# Patient Record
Sex: Female | Born: 1990 | Race: White | Hispanic: No | Marital: Married | Smoking: Never smoker
Health system: Southern US, Community
[De-identification: ages and names within clinical notes are randomized; demographics above are authoritative.]

## PROBLEM LIST (undated history)

## (undated) DIAGNOSIS — F329 Major depressive disorder, single episode, unspecified: Secondary | ICD-10-CM

## (undated) DIAGNOSIS — F32A Depression, unspecified: Secondary | ICD-10-CM

## (undated) DIAGNOSIS — K5090 Crohn's disease, unspecified, without complications: Secondary | ICD-10-CM

## (undated) DIAGNOSIS — K5010 Crohn's disease of large intestine without complications: Secondary | ICD-10-CM

## (undated) DIAGNOSIS — F419 Anxiety disorder, unspecified: Secondary | ICD-10-CM

## (undated) DIAGNOSIS — D649 Anemia, unspecified: Secondary | ICD-10-CM

## (undated) DIAGNOSIS — N809 Endometriosis, unspecified: Secondary | ICD-10-CM

## (undated) HISTORY — PX: PARTIAL COLECTOMY: SHX5273

## (undated) HISTORY — DX: Endometriosis, unspecified: N80.9

## (undated) HISTORY — DX: Anemia, unspecified: D64.9

## (undated) HISTORY — DX: Depression, unspecified: F32.A

## (undated) HISTORY — PX: WISDOM TOOTH EXTRACTION: SHX21

## (undated) HISTORY — DX: Crohn's disease of large intestine without complications: K50.10

## (undated) HISTORY — DX: Major depressive disorder, single episode, unspecified: F32.9

## (undated) HISTORY — DX: Anxiety disorder, unspecified: F41.9

## (undated) HISTORY — PX: OTHER SURGICAL HISTORY: SHX169

## (undated) HISTORY — PX: COLONOSCOPY: SHX174

---
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
## 2008-08-19 ENCOUNTER — Ambulatory Visit: Payer: Self-pay | Admitting: Family Medicine

## 2008-08-19 ENCOUNTER — Encounter: Payer: Self-pay | Admitting: Family Medicine

## 2008-08-19 DIAGNOSIS — L600 Ingrowing nail: Secondary | ICD-10-CM | POA: Insufficient documentation

## 2008-09-06 ENCOUNTER — Emergency Department (HOSPITAL_COMMUNITY): Admission: EM | Admit: 2008-09-06 | Discharge: 2008-09-06 | Payer: Self-pay | Admitting: Family Medicine

## 2008-12-03 ENCOUNTER — Ambulatory Visit: Payer: Self-pay | Admitting: Family Medicine

## 2008-12-03 ENCOUNTER — Telehealth: Payer: Self-pay | Admitting: Family Medicine

## 2008-12-03 ENCOUNTER — Encounter (INDEPENDENT_AMBULATORY_CARE_PROVIDER_SITE_OTHER): Payer: Self-pay | Admitting: Family Medicine

## 2008-12-03 LAB — CONVERTED CEMR LAB
Basophils Absolute: 0 10*3/uL (ref 0.0–0.1)
Basophils Relative: 0 % (ref 0–1)
Eosinophils Absolute: 0.2 10*3/uL (ref 0.0–0.7)
Eosinophils Relative: 2 % (ref 0–5)
HCT: 36.4 % (ref 36.0–46.0)
Hemoglobin: 11.7 g/dL — ABNORMAL LOW (ref 12.0–15.0)
Lymphocytes Relative: 30 % (ref 12–46)
Lymphs Abs: 2.2 10*3/uL (ref 0.7–4.0)
MCHC: 32.1 g/dL (ref 30.0–36.0)
MCV: 82.4 fL (ref 78.0–100.0)
Monocytes Absolute: 0.7 10*3/uL (ref 0.1–1.0)
Monocytes Relative: 10 % (ref 3–12)
Neutro Abs: 4.4 10*3/uL (ref 1.7–7.7)
Neutrophils Relative %: 58 % (ref 43–77)
Platelets: 371 10*3/uL (ref 150–400)
RBC: 4.42 M/uL (ref 3.87–5.11)
RDW: 13.7 % (ref 11.5–15.5)
WBC: 7.6 10*3/uL (ref 4.0–10.5)

## 2008-12-05 ENCOUNTER — Encounter: Payer: Self-pay | Admitting: Family Medicine

## 2008-12-05 ENCOUNTER — Telehealth: Payer: Self-pay | Admitting: Family Medicine

## 2008-12-05 ENCOUNTER — Ambulatory Visit (HOSPITAL_COMMUNITY): Admission: RE | Admit: 2008-12-05 | Discharge: 2008-12-05 | Payer: Self-pay | Admitting: Family Medicine

## 2008-12-05 ENCOUNTER — Ambulatory Visit: Payer: Self-pay | Admitting: Family Medicine

## 2008-12-05 LAB — CONVERTED CEMR LAB
Blood in Urine, dipstick: NEGATIVE
Glucose, Urine, Semiquant: NEGATIVE
Nitrite: NEGATIVE
Specific Gravity, Urine: 1.025
WBC Urine, dipstick: NEGATIVE
Whiff Test: NEGATIVE

## 2008-12-06 ENCOUNTER — Encounter: Payer: Self-pay | Admitting: Family Medicine

## 2008-12-07 ENCOUNTER — Encounter: Payer: Self-pay | Admitting: Family Medicine

## 2008-12-09 ENCOUNTER — Encounter: Payer: Self-pay | Admitting: Family Medicine

## 2008-12-17 ENCOUNTER — Ambulatory Visit: Payer: Self-pay | Admitting: Family Medicine

## 2009-02-06 ENCOUNTER — Encounter: Payer: Self-pay | Admitting: Family Medicine

## 2009-02-06 ENCOUNTER — Ambulatory Visit: Payer: Self-pay | Admitting: Family Medicine

## 2009-04-04 ENCOUNTER — Emergency Department (HOSPITAL_COMMUNITY): Admission: EM | Admit: 2009-04-04 | Discharge: 2009-04-04 | Payer: Self-pay | Admitting: Family Medicine

## 2009-05-13 ENCOUNTER — Telehealth: Payer: Self-pay | Admitting: Family Medicine

## 2009-05-14 ENCOUNTER — Encounter: Payer: Self-pay | Admitting: Family Medicine

## 2009-05-14 ENCOUNTER — Ambulatory Visit: Payer: Self-pay | Admitting: Family Medicine

## 2009-05-14 DIAGNOSIS — R300 Dysuria: Secondary | ICD-10-CM | POA: Insufficient documentation

## 2009-09-11 ENCOUNTER — Telehealth (INDEPENDENT_AMBULATORY_CARE_PROVIDER_SITE_OTHER): Payer: Self-pay | Admitting: *Deleted

## 2009-09-14 ENCOUNTER — Ambulatory Visit: Payer: Self-pay | Admitting: Family Medicine

## 2009-09-14 DIAGNOSIS — L2089 Other atopic dermatitis: Secondary | ICD-10-CM | POA: Insufficient documentation

## 2009-09-14 DIAGNOSIS — N926 Irregular menstruation, unspecified: Secondary | ICD-10-CM | POA: Insufficient documentation

## 2009-09-14 DIAGNOSIS — N939 Abnormal uterine and vaginal bleeding, unspecified: Secondary | ICD-10-CM

## 2009-09-24 ENCOUNTER — Encounter: Payer: Self-pay | Admitting: Family Medicine

## 2009-09-24 ENCOUNTER — Telehealth: Payer: Self-pay | Admitting: Family Medicine

## 2009-09-24 ENCOUNTER — Ambulatory Visit: Payer: Self-pay | Admitting: Family Medicine

## 2010-03-02 NOTE — Assessment & Plan Note (Signed)
Summary: very heavy menses/[name]/Leslie Bolton   Vital Signs:  Patient profile:   20 year old female Height:      68.2 inches Weight:      144 pounds BMI:     21.85 Temp:     98.2 degrees F oral Pulse rate:   59 / minute BP sitting:   114 / 73  (left arm) Cuff size:   regular  Vitals Entered By: Schuyler Amor CMA (September 24, 2009 9:21 AM) CC: Heavy menses Pain Assessment Patient in pain? yes     Location: abdomen Intensity: 6   Primary Care Provider:  [name] MD  CC:  Heavy menses.  History of Present Illness: 20 y/o F here for menorrhagia x 1 day  Menses usually lasts 5 days.  Pt endorses history heavy menses.  Usually she changes tampons every 3 hrs for days 3 and 4 of cycle.  But today she is having to double up on tampons AND pads and is changing 4x  in 3 hrs.  This is not normal for her.   She has been on birth control pills for 2 yrs.  She was complaining of painful periods so in April Dr [name] advised her to take OCP continuously for 3 months, then herself have one cycle.  This is her 2nd cycle since starting this regimen.  For the most part last menstrual cycle was  normal. 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   Allergies: No Known Drug Allergies  Review of Systems       per hpi  Physical Exam  General:  Well-developed,well-nourished,in no acute distress; alert,appropriate and cooperative throughout examination Lungs:  Normal respiratory effort, chest expands symmetrically. Lungs are clear to auscultation, no crackles or wheezes. Heart:  Normal rate and regular rhythm. S1 and S2 normal without gallop, murmur, click, rub or other extra sounds. Genitalia:  pelvic exam wnl   Impression & Recommendations:  Problem # 1:  ABNORMAL VAGINAL BLEEDING (ICD-626.9) Assessment  New Menorrhagia that started today, 2nd day of her menstrual cycle.  Cycle usually lasts 5 days.  Hb normal at 12.3.   Since this is the first instance of menorrhagia since changing the ocp routine to 3 consecutive months before a menses, I would not like to make changes at this time.  I do expect her cycle to finish this weekend, or at the least decrease in quantity.  Pt to rtc on Monday if still bleeding.  We discussed the plan for future work up should this occur again, including ultrasound.  We discussed premarin to stop bleeding if needed.   Discussed ibuprofen 660m qid, which has been shown to decrease menstrual quantity. I sent rx in but pt can take otc ibuprofen 2075m(3tabs) if she likes.    Her updated medication list for this problem includes:    Apri 0.15-30 Mg-mcg Tabs (Desogestrel-ethinyl estradiol) ...[name]. Take as directed  Orders: Hemoglobin-[facility] ([id number] Level  3 ([phone] Complete Medication List: 1)  Apri 0.15-30 Mg-mcg Tabs (Desogestrel-ethinyl estradiol) .... Take as directed 2)  Ibuprofen 600 Mg Tabs (Ibuprofen) ...[name] 1 tab by mouth four times daily for pain Prescriptions: IBUPROFEN 600 MG TABS (IBUPROFEN) 1 tab by mouth four times daily for pain  #20 x 3   Entered and Authorized by:   CaMerla RichesD  Signed by:   Merla Riches MD on 09/24/2009   Method used:   Electronically to        [facility] [address] # [phone]* (retail)       [address]       [place], [place]  [address]       Ph: [phone]       Fax: [phone]   RxID:   [id number]   Laboratory Results   Blood Tests   Date/Time Received: September 24, 2009 10:15 AM  Date/Time Reported: September 24, 2009 10:32 AM     CBC   HGB:  12.3 g/dL   (Normal Range: 13.0-17.0 in Males, 12.0-15.0 in Females) Comments: ...........test performed by...........Marland KitchenHedy Camara, CMA

## 2010-03-02 NOTE — Progress Notes (Signed)
Summary: triage  Phone Note Call from Patient Call back at [phone]   Caller: Patient Summary of Call: Has a rash on upper chest, upper back and on neck.  Initial call taken by: Raymond Gurney,  September 11, 2009 4:06 PM  Follow-up for Phone Call        spoke with patient and she reports rash on upper chest and back and neck, started yesterday. does not itch. does not look like hives she states. appointment scheduled for Monday to have checked.   then patient states she has been on birth control for 2 years and this week she has had some irregular bleeding which she describes as heavy. she is having some cramping .  she is in the middle of her cycle . has not missed any pills . having to use 3 tampons  or pads daily. suggested she go somewhere this weekend to have evaluated if cramping becomes worse. will advise MD of this problem also so this can be addressed Monday if she does not  have it checked into sooner. Follow-up by: Marcell Barlow RN,  September 11, 2009 4:18 PM

## 2010-03-02 NOTE — Assessment & Plan Note (Signed)
Summary: uti per pt/[place]/neal   Vital Signs:  Patient profile:   20 year old female Weight:      153 pounds Temp:     98 degrees F oral Pulse rate:   64 / minute Pulse rhythm:   regular BP sitting:   116 / 73  (right arm) Cuff size:   regular  Vitals Entered By: Audelia Hives CMA (May 14, 2009 8:34 AM) CC: ?UTI and change birth control   CC:  ?UTI and change birth control.  History of Present Illness: 1.  ?UTI--urgency, frequency, and dysuria past 2 days.  some back discomfort and nausea, but no hematuria, fever, abd pain.  has not had a uti since she was a child.  2.  birth control.  interested in talking about different birth control options.  has been on apri ocps for a while, but still has heavy periods and cramping.  interested in exploring other options that may help  Current Medications (verified): 1)  Apri 0.15-30 Mg-Mcg  Tabs (Desogestrel-Ethinyl Estradiol) .... As Directed  Allergies: No Known Drug Allergies  Review of Systems GU:  Complains of urinary frequency and urinary hesitancy; denies abnormal vaginal bleeding, discharge, and hematuria.  Physical Exam  General:  Well-developed,well-nourished,in no acute distress; alert,appropriate and cooperative throughout examination Abdomen:  soft.  very mild suprapubic tenderness on palpation.  soft, non-tender, no distention, no masses, no guarding, no rigidity, and no rebound tenderness.   Additional Exam:  vital signs reviewed    Impression & Recommendations:  Problem # 1:  DYSURIA (ICD-788.1) Assessment New  symptoms consistent with uti.  will treat with cipro.  send urine for culture.   Orders: Urinalysis-[facility] (00000) Urine Culture-[facility] ([id number]) [place]- Est Level  3 ([address]323)  Her updated medication list for this problem includes:    Ciprofloxacin Hcl 250 Mg Tabs (Ciprofloxacin hcl) .[name]... 1 tab by mouth two times a day for 5 days.  Problem # 2:  ORAL CONTRACEPTION (ICD-V25.41) Assessment:  Unchanged  discussed options.  she would like to try skipping the placebo pills for 2 months then taking them the third month.  this would cut down on thenumber of periods she has.  will send new prescription  Orders: [facility]- Est Level  3 (55732)  Complete Medication List: 1)  Apri 0.15-30 Mg-mcg Tabs (Desogestrel-ethinyl estradiol) .... Take as directed 2)  Ciprofloxacin Hcl 250 Mg Tabs (Ciprofloxacin hcl) .[name].. 1 tab by mouth two times a day for 5 days.  Patient Instructions: 1)  It was nice to see you today. 2)  You have a urinary tract infection.   3)  Take the antibiotics I prescribed you. 4)  For your birth control, on the 1st pack, take the first 21 pills (one pill daily).  Then skip the last 7 pills.  Do the same thing for the second pack.  Then on the 3rd pack, take all the pills.  Then start the process over. 5)  Call me if you have any problems or questions. Prescriptions: CIPROFLOXACIN HCL 250 MG TABS (CIPROFLOXACIN HCL) 1 tab by mouth two times a day for 5 days.  #10 x 0   Entered and Authorized by:   Carin Hock MD   Signed by:   Carin Hock MD on 05/14/2009   Method used:   Electronically to        [facility]. [address] [phone]* (retail)       [address]  [place], [place]  [address]       Ph: [phone] or [phone]       Fax: [phone]   RxID:   [id number] APRI 0.15-30 MG-MCG  TABS (DESOGESTREL-ETHINYL ESTRADIOL) take as directed  #3 packs x 4   Entered and Authorized by:   Carin Hock MD   Signed by:   Carin Hock MD on 05/14/2009   Method used:   Electronically to        [facility]. [address] [phone]* (retail)       [address]       [place], [place]  [address]       Ph: [phone] or [phone]       Fax: [phone]   RxID:   [id number]   Appended Document: urine report    Lab Visit  Laboratory Results   Urine Tests  Date/Time Received: May 14, 2009 8:39  AM  Date/Time Reported: May 14, 2009 9:10 AM   Routine Urinalysis   Color: yellow Appearance: Cloudy Glucose: negative   (Normal Range: Negative) Bilirubin: negative   (Normal Range: Negative) Ketone: negative   (Normal Range: Negative) Spec. Gravity: 1.020   (Normal Range: 1.003-1.035) Blood: moderate   (Normal Range: Negative) pH: 5.5   (Normal Range: 5.0-8.0) Protein: 100   (Normal Range: Negative) Urobilinogen: 0.2   (Normal Range: 0-1) Nitrite: negative   (Normal Range: Negative) Leukocyte Esterace: moderate   (Normal Range: Negative)  Urine Microscopic WBC/HPF: LOADED RBC/HPF: >20 Bacteria/HPF: 2+ Epithelial/HPF: rare    Comments: ...............test performed by......Marland KitchenBonnie A. Martinique, MLS (ASCP)cm    Orders Today:

## 2010-03-02 NOTE — Miscellaneous (Signed)
Summary: Procedure Consent  Procedure Consent   Imported By: Audie Clear 02/10/2009 15:47:39  _____________________________________________________________________  External Attachment:    Type:   Image     Comment:   External Document

## 2010-03-02 NOTE — Assessment & Plan Note (Signed)
Summary: ingrown toenail,df   Vital Signs:  Patient profile:   20 year old female Weight:      159.9 pounds Temp:     98.1 degrees F oral Pulse rate:   66 / minute Pulse rhythm:   regular BP sitting:   120 / 77  (right arm) Cuff size:   regular  Vitals Entered By: Audelia Hives CMA (February 06, 2009 1:36 PM)  CC:  toenail ingrown again.  History of Present Illness: 1.  toenail--partial right great toe medial surface removedl on 7/20.  did well until late december.  looks ingrown again.  hurting.  a little red.  maybe some pus coming out.  soaking it in hydrogen peroxide and putting neosporin on it.    no fevers.  no rashes.    Allergies: No Known Drug Allergies  Past History:  Past Medical History: neg ingrown toenail right great toe  Physical Exam  General:  Well-developed,well-nourished,in no acute distress; alert,appropriate and cooperative throughout examination Extremities:  right great toe:  ingrown toenail medial surface.  mild surrounding erythema just on distal tow.  ttp.  no pus Additional Exam:  vital signs reviewed    Impression & Recommendations:  Problem # 1:  INGROWN TOENAIL (ICD-703.0) Assessment Deteriorated  doubt infection.  partial removal again.    Procedure note:  verbal and written consent obtained.  Areas prepped with betadine.  tourniquet placed on right 1st toe.  digit block with 2% lidocaine without epi.  lateral aspect of nail removed.  phenol applied.  area cleansed with alcohol.  minimal bleeding.   Pt tolerated procedure well.  No complications.    gave red flags for return.  gave instructions for wound care    Orders: [facility]- Est Level  3 (33582) Provider Misc Charge- [facility] (Misc)  Complete Medication List: 1)  Apri 0.15-30 Mg-mcg Tabs (Desogestrel-ethinyl estradiol) .... As directed  Patient Instructions: 1)  verbal instructions given

## 2010-03-02 NOTE — Assessment & Plan Note (Signed)
Summary: RASH/LS   Vital Signs:  Patient profile:   20 year old female Height:      68.2 inches Weight:      148 pounds BMI:     22.45 BSA:     1.80 Temp:     98.0 degrees F Pulse rate:   57 / minute BP sitting:   120 / 74  Vitals Entered By: Christen Bame CMA (September 14, 2009 9:28 AM) CC: rash x 4 days, OCP complaint Is Patient Diabetic? No Pain Assessment Patient in pain? no        Primary Care Provider:  [name] MD  CC:  rash x 4 days and OCP complaint.  History of Present Illness: Pt presents with a pruritic rash since thursday.  no fevers, no new meds, no new soaps, not worse with heat.  mostly behind ears, some on chest and back. better with benedryl.    OCPs- recently changed to taking OCP for 3 months straight then a pill free period.  knows to toss the placebos, taking daily.  did well the first cycle, this time having breakthrough bleeding.  No unusual cramps, abd pain.  no new sexual partners.  last intercourse 6 weeks ago.  Habits & Providers  Alcohol-Tobacco-Diet     Tobacco Status: never  Current Medications (verified): 1)  Apri 0.15-30 Mg-Mcg  Tabs (Desogestrel-Ethinyl Estradiol) .... Take As Directed  Allergies (verified): No Known Drug Allergies  Review of Systems  The patient denies anorexia, fever, and headaches.    Physical Exam  General:  Well-developed,well-nourished,in no acute distress; alert,appropriate and cooperative throughout examination Lungs:  Normal respiratory effort, chest expands symmetrically. Lungs are clear to auscultation, no crackles or wheezes. Heart:  Normal rate and regular rhythm. S1 and S2 normal without gallop, murmur, click, rub or other extra sounds. Abdomen:  Bowel sounds positive,abdomen soft and non-tender without masses, organomegaly or hernias noted. Skin:  pink rash behind ears with papules.  no drainage, looked slightly excoriated.  no vesicles.  papules on chest and back were heterogeneous, some looked  more like vesicles, some papules.  Also some acne present. Cervical Nodes:  No lymphadenopathy noted   Impression & Recommendations:  Problem # 1:  DERMATITIS, ATOPIC (ICD-691.8) Assessment New likely atopic dermatitis due to some contact allergen.  already improving.  advised benedryl at night, claritin during day if desired, and some amounts of hydrocortisone topically for itch. ?poison ivy but does not have vesicles behind ears. Orders: [place]- Est Level  3 (01749)  Problem # 2:  ABNORMAL VAGINAL BLEEDING (ICD-626.9) Assessment: New likely breakthrough due to change in administration of pills.  u preg negative.  advised to RTC if this continued through multiple cycles to reevaluate how she took the pills Her updated medication list for this problem includes:    Apri 0.15-30 Mg-mcg Tabs (Desogestrel-ethinyl estradiol) .[name]... Take as directed  Orders: U Preg-[facility] (81025) [place]- Est Level  3 (44967)  Complete Medication List: 1)  Apri 0.15-30 Mg-mcg Tabs (Desogestrel-ethinyl estradiol) .... Take as directed  Laboratory Results   Urine Tests  Date/Time Received: September 14, 2009 9:54 AM  Date/Time Reported: September 14, 2009 10:01 AM     Urine HCG: negative Comments: ...............test performed by......Marland KitchenBonnie A. Martinique, MLS (ASCP)cm

## 2010-03-02 NOTE — Progress Notes (Signed)
Summary: triage  Phone Note Call from Patient Call back at [phone]   Caller: Patient Summary of Call: has uti and would like something called in- has appt on Friday but can't wait that long and has to work [employer] Initial call taken by: Audie Clear,  May 13, 2009 1:49 PM  Follow-up for Phone Call        told her we cannot rx without seeing her. appt at 8:15 tomorrow am to accomodate her schedule. meantime to drink plenty of water & take ibu or tylenol Follow-up by: Elige Radon RN,  May 13, 2009 1:52 PM

## 2010-03-02 NOTE — Letter (Signed)
Summary: Out of [place]  [address]   [place], [place] [address]   Phone: [phone]  Fax: [phone]    September 24, 2009   Student:  MYESHA STILLION    To Whom It May Concern:   For Medical reasons, please excuse the above named student from school for the following dates:  Start:   September 24, 2009  Can return on September 25, 2009.   If you need additional information, please feel free to contact our office.   Sincerely,    Cat Ta MD    ****This is a legal document and cannot be tampered with.  Schools are authorized to verify all information and to do so accordingly.

## 2010-03-02 NOTE — Progress Notes (Signed)
Summary: triage  Phone Note Call from Patient Call back at [phone]   Caller: Patient Summary of Call: Pt is having her period and had to use 4 tampons in 3 hrs.   Initial call taken by: Raymond Gurney,  September 24, 2009 8:43 AM  Follow-up for Phone Call        states this very unusual for her. started today after 3 hrs at work. concerned & wants to be seen before she goes to class at 10:30. asked her to come in now Follow-up by: Elige Radon RN,  September 24, 2009 8:49 AM  Additional Follow-up for Phone Call Additional follow up Details #1::        TRIAGE I will see her in [facility] cliinic when she gets here  [name] MD  September 24, 2009 10:01 AM oops already seen by Dr Earley Favor

## 2010-03-11 ENCOUNTER — Encounter: Payer: Self-pay | Admitting: *Deleted

## 2010-03-18 ENCOUNTER — Telehealth: Payer: Self-pay | Admitting: Family Medicine

## 2010-03-19 ENCOUNTER — Telehealth: Payer: Self-pay | Admitting: Family Medicine

## 2010-03-19 MED ORDER — NORETHIN ACE-ETH ESTRAD-FE 1-20 MG-MCG(24) PO TABS: 1.0000 | ORAL_TABLET | Freq: Every day | ORAL | Status: DC

## 2010-03-19 NOTE — Telephone Encounter (Signed)
Change in OCP

## 2010-03-26 NOTE — Telephone Encounter (Signed)
Has been taken car of

## 2010-06-21 ENCOUNTER — Ambulatory Visit: Payer: Self-pay | Admitting: Family Medicine

## 2010-11-30 ENCOUNTER — Telehealth: Payer: Self-pay | Admitting: Family Medicine

## 2010-11-30 NOTE — Telephone Encounter (Signed)
Please call Leslie Bolton at earliest convenience tomorrow to disuss bc rx

## 2010-12-01 MED ORDER — DESOGESTREL-ETHINYL ESTRADIOL 0.15-30 MG-MCG PO TABS: 1.0000 | ORAL_TABLET | Freq: Every day | ORAL | Status: DC

## 2010-12-01 NOTE — Telephone Encounter (Signed)
Acne worse on the current med--wantsto change back

## 2010-12-02 ENCOUNTER — Ambulatory Visit: Payer: Self-pay | Admitting: Family Medicine

## 2011-01-13 ENCOUNTER — Other Ambulatory Visit: Payer: Self-pay | Admitting: Family Medicine

## 2011-01-13 MED ORDER — DESOGESTREL-ETHINYL ESTRADIOL 0.15-30 MG-MCG PO TABS: 1.0000 | ORAL_TABLET | Freq: Every day | ORAL | Status: DC

## 2011-01-14 ENCOUNTER — Ambulatory Visit
Admission: RE | Admit: 2011-01-14 | Discharge: 2011-01-14 | Disposition: A | Discharge status: Home / Self Care | Payer: Managed Care, Other (non HMO) | Source: Ambulatory Visit | Attending: Family Medicine | Admitting: Family Medicine

## 2011-01-14 ENCOUNTER — Ambulatory Visit (INDEPENDENT_AMBULATORY_CARE_PROVIDER_SITE_OTHER): Payer: Managed Care, Other (non HMO) | Admitting: Family Medicine

## 2011-01-14 DIAGNOSIS — M25532 Pain in left wrist: Secondary | ICD-10-CM

## 2011-01-14 DIAGNOSIS — M25539 Pain in unspecified wrist: Secondary | ICD-10-CM

## 2011-01-14 NOTE — Progress Notes (Signed)
  Subjective:    Patient ID: Leslie Bolton, female    DOB: Jan 06, 1991, 20 y.o.   MRN: [id number]  HPI  Date of injury: Bolton 13, 2012. Left wrist pain since last night. She was working on an elliptical (exercise equipment) when equipment accidentally turned over and fell on her left wrist. She had immediate pain. Pain has not gotten much better. She's not had a lot of swelling. She is essentially unable to use the wrist because of pain. PERTINENT  PMH / PSH: No prior history of left wrist injury or left wrist surgery. Not diabetic. Nonsmoker.  Review of Systems    denies unusual weight change, denies fever. Has seen mild swelling of the wrist. Denies numbness or tingling in her hand. Objective:   Physical Exam  GENERAL: Well-developed female no acute distress LEFT WRIST: There to palpation dorsally and over the snuffbox. There is no deformity noted. There is no bruising. There is a very impressive from where one of the screws on the machine if her wrist. She has pain with wrist extension greater than wrist flexion. She does have full range of motion passively in extension and flexion. Grip strength is somewhat limited by pain but essentially normal. All fingers and thumb at all range of motion and normal strength. ULTRASOUND:   Attempted ultrasound of the left wrist. There is an apparent irregularity at the distal radius. She was extremely tender so I did not complete the exam and I will not charge for this.     Assessment & Plan:  Wrist pain, suspect fracture. Some concern for scaphoid injury. Placed her in  scaphoid splint. Will send her for xrays left wrist and scaphoid views. I will call her this afternoon with results.[name]

## 2011-01-14 NOTE — Progress Notes (Signed)
Cell # Is [id number] Xray negative per radiology---there is a very small dorsal cortex "fragment" that is probably artifact. I discussed with her. Rec splint next  Few days weaning out of it toward the end of the week--if she is not 80-90% better by end of week, she will call and be seen for further eval.

## 2011-01-21 ENCOUNTER — Ambulatory Visit (INDEPENDENT_AMBULATORY_CARE_PROVIDER_SITE_OTHER): Payer: Managed Care, Other (non HMO) | Admitting: Family Medicine

## 2011-01-21 VITALS — BP 120/68

## 2011-01-21 DIAGNOSIS — M25532 Pain in left wrist: Secondary | ICD-10-CM

## 2011-01-21 DIAGNOSIS — M25539 Pain in unspecified wrist: Secondary | ICD-10-CM

## 2011-01-29 DIAGNOSIS — M25532 Pain in left wrist: Secondary | ICD-10-CM | POA: Insufficient documentation

## 2011-01-29 NOTE — Progress Notes (Signed)
  Subjective:    Patient ID: Leslie Bolton, female    DOB: Jun 29, 1990, 20 y.o.   MRN: [id number]  HPI  F/u wrist injury. Has been wearing brace, still very sore and painful. Review of Systems  No numbness in hand, no redness of wrist, no fever.    Objective:   Physical Exam  Vital signs reviewed. GENERAL: Well developed, well nourished, no acute distress Left wrist still swollen and ttp over dorsal aspect. FROm and full strength all fingers, normal sensation in hand.       Assessment & Plan:  Wrist pain, x ray negative. She has a bad contusion to the bone--will continue spica splint and f/u 1-2 weeks. Likely at that time if resolving, start brief rehab for wrist strength.

## 2011-02-07 ENCOUNTER — Ambulatory Visit: Payer: Managed Care, Other (non HMO) | Admitting: Family Medicine

## 2011-12-27 ENCOUNTER — Encounter: Payer: Self-pay | Admitting: Family Medicine

## 2011-12-27 ENCOUNTER — Ambulatory Visit (INDEPENDENT_AMBULATORY_CARE_PROVIDER_SITE_OTHER): Payer: Managed Care, Other (non HMO) | Admitting: Family Medicine

## 2011-12-27 VITALS — BP 103/73 | HR 75 | Temp 98.3°F | Wt 180.2 lb

## 2011-12-27 DIAGNOSIS — R739 Hyperglycemia, unspecified: Secondary | ICD-10-CM

## 2011-12-27 DIAGNOSIS — R079 Chest pain, unspecified: Secondary | ICD-10-CM

## 2011-12-27 DIAGNOSIS — R635 Abnormal weight gain: Secondary | ICD-10-CM

## 2011-12-27 DIAGNOSIS — R7309 Other abnormal glucose: Secondary | ICD-10-CM

## 2011-12-27 MED ORDER — METFORMIN HCL 500 MG PO TABS: 500.0000 mg | ORAL_TABLET | Freq: Two times a day (BID) | ORAL | Status: DC

## 2011-12-27 NOTE — Assessment & Plan Note (Signed)
Unclear etiology for weight gain.  She is very active and it is possible this activity has triggered her hunger to point where she overeats more than she can burn off when she exercises (runs 2-3 miles 2 times a week).   CBG here was within normal limits for fasting. Discussed risks and benefits of starting Metformin.  Discussed GI distress.  Patient expressed understanding and wishes to proceed to see if it helps with weight loss. Only thought would be to check TSH if patient continues to experience weight gain.   Unlikely secondary to OCPs as she has been on these for years without any previous episodes of weight gain.   FU 4 -6 weeks for re-evaluation, sooner if any side effects from medicine or continued weight gain.

## 2011-12-27 NOTE — Patient Instructions (Addendum)
Take the Metformin 1 pill with a meal once a day for the first 3 days.  After that, take it twice daily with meals.  Come back in 4 - 6 weeks so we can see how you're doing on the medicine.    If you start noticing any redness or swelling in your legs, trouble breathing, or chest pain with exertion please come back to see [place] or go to the ED.    It was good to meet you today!

## 2011-12-27 NOTE — Progress Notes (Signed)
Patient ID: Leslie Bolton, female   DOB: [birth year], 21 y.o.   MRN: [id number] Leslie Bolton is a 21 y.o. female who presents to [facility] today for 2 concerns:  1.  Weight gain:  Present for past 2-3 months.  Started counseling this past summer and was put on Fluoxetine in September.  Stopped this in October.  Noted weight gain in October.  Baseline weight around 150 lbs.  Not weighing herself at home but does note that her clothes are tighter fitting and that she feels like she has gained weight.  Was surprised at the scale when she saw her weight today.  Mother is a Engineer, maintenance (IT) and she is very health-conscious, eating mostly only meats, fruits, and vegetables.  Also very active with cycling and running during the week and horseback riding on the weekends.  Had cousin who had very similar symptoms and was put on Metformin and experienced weight loss, asking if she can try this.    Has been on same form of OCP for several years.  No nausea or vomiting, no heat or cold intolerance.  No hirsutism.    2.  Chest pain:  Present for past 4 - 6 weeks, unsure exactly how long.  Describes fluttering and "throbbing" in upper left chest that occurs only when she is at work.  When she focuses on her breathing, this resolves.  Believes it is related to stress.  No chest pain or dyspnea on exertion.  Denies any chest pain at any other time of day.  Not related to food intake.  No lower extremity edema or redness.  Does note some varicose veins on Left calf.    The following portions of the patient's history were reviewed and updated as appropriate: allergies, current medications, past medical history, family and social history, and problem list.  Patient is a nonsmoker.  History reviewed. No pertinent past medical history.  ROS as above otherwise neg. No Chest pain, palpitations, SOB, Fever, Chills, Abd pain, N/V/D.  Medications reviewed. Current Outpatient Prescriptions  Medication Sig Dispense Refill  .  desogestrel-ethinyl estradiol (APRI,EMOQUETTE,SOLIA) 0.15-30 MG-MCG tablet Take 1 tablet by mouth daily.  3 Package  3  . ibuprofen (ADVIL,MOTRIN) 600 MG tablet Take 600 mg by mouth 4 (four) times daily. For pain               Exam:  BP 103/73  Pulse 75  Temp 98.3 F (36.8 C) (Oral)  Wt 180 lb 3.2 oz (81.738 kg) Gen: Well NAD, overweight.  Pleasant and conversant.   HEENT: EOMI,  MMM Lungs: CTABL Nl WOB Heart: RRR Abd: NABS, NT, ND Exts: Non edematous BL  LE, warm and well perfused, calves symmetric and nontender.  Faint varicose veins noted Left calf.    Results for orders placed in visit on 12/27/11 (from the past 72 hour(s))  GLUCOSE, CAPILLARY     Status: Normal   Collection Time   12/27/11 10:04 AM      Component Value Range Comment   Glucose-Capillary 80  70 - 99 mg/dL

## 2011-12-27 NOTE — Assessment & Plan Note (Signed)
No red flags.  Not cardiac chest pain, also does not seem to be pulmonary chest pain as no dyspnea or shortness of breath. Only concern is female of this age on OCPs would be PE, but she has not LE edema, no tachypnea, no palpitations/tachycardia, and no dyspnea.  Chest pain only occurs at work and never at other times, resolved with deep breathing.   Likely related to stress.   I provided the patient with explicit warnings and red flags both written and verbally that would prompt return to clinic or the ED.

## 2012-05-03 ENCOUNTER — Telehealth: Payer: Self-pay | Admitting: Family Medicine

## 2012-05-03 NOTE — Telephone Encounter (Signed)
2 issues:    1) we don't generally prescribe weight loss drugs at this clinic 2) if she does want something, she would need to schedule with her PCP Dr. Nori Riis, not with a work-in physician.

## 2012-05-03 NOTE — Telephone Encounter (Signed)
LVM for patein to call back to inform her of below.

## 2012-05-03 NOTE — Telephone Encounter (Signed)
Patient is calling to ask Dr. Mingo Amber about changing Metformin to Phentermine.

## 2012-05-03 NOTE — Telephone Encounter (Signed)
Related message,pt already scheduled appt with Dr Nori Riis. Janeya Deyo, Lewie Loron

## 2012-05-23 ENCOUNTER — Encounter: Payer: Self-pay | Admitting: Family Medicine

## 2012-05-23 ENCOUNTER — Ambulatory Visit (INDEPENDENT_AMBULATORY_CARE_PROVIDER_SITE_OTHER): Payer: BC Managed Care – PPO | Admitting: Family Medicine

## 2012-05-23 VITALS — BP 117/68 | HR 71 | Temp 99.1°F | Ht 68.0 in | Wt 184.0 lb

## 2012-05-23 DIAGNOSIS — R635 Abnormal weight gain: Secondary | ICD-10-CM

## 2012-05-23 DIAGNOSIS — R5383 Other fatigue: Secondary | ICD-10-CM

## 2012-05-23 DIAGNOSIS — Z309 Encounter for contraceptive management, unspecified: Secondary | ICD-10-CM | POA: Insufficient documentation

## 2012-05-23 DIAGNOSIS — R5381 Other malaise: Secondary | ICD-10-CM

## 2012-05-23 LAB — CBC WITH DIFFERENTIAL/PLATELET
Eosinophils Absolute: 0.2 10*3/uL (ref 0.0–0.7)
Eosinophils Relative: 2 % (ref 0–5)
HCT: 41.2 % (ref 36.0–46.0)
Hemoglobin: 13.5 g/dL (ref 12.0–15.0)
Lymphocytes Relative: 29 % (ref 12–46)
Lymphs Abs: 2.2 10*3/uL (ref 0.7–4.0)
MCH: 28.1 pg (ref 26.0–34.0)
MCV: 85.8 fL (ref 78.0–100.0)
Monocytes Absolute: 0.7 10*3/uL (ref 0.1–1.0)
Monocytes Relative: 10 % (ref 3–12)
Platelets: 340 10*3/uL (ref 150–400)
RBC: 4.8 MIL/uL (ref 3.87–5.11)

## 2012-05-23 LAB — COMPREHENSIVE METABOLIC PANEL
Alkaline Phosphatase: 70 U/L (ref 39–117)
Creat: 0.74 mg/dL (ref 0.50–1.10)
Glucose, Bld: 84 mg/dL (ref 70–99)
Sodium: 138 mEq/L (ref 135–145)
Total Bilirubin: 0.4 mg/dL (ref 0.3–1.2)
Total Protein: 7.2 g/dL (ref 6.0–8.3)

## 2012-05-23 NOTE — Assessment & Plan Note (Signed)
I reviewed her flow sheet which does show fairly significant increase in the last couple of years. This does not coincide directly with her oral contraceptives and we certainly have not changed those. I know her mom and have reviewed her diet some today. I think she would probably benefit from a nutrition appointment. Also want to check thyroid.

## 2012-05-23 NOTE — Progress Notes (Signed)
  Subjective:    Patient ID: Leslie Bolton, female    DOB: 06/27/1990, 22 y.o.   MRN: [id number]  HPI Followup weight gain. We had seen her previously twice before regarding her essentially 40 pound weight gain the last couple of years. Dr. Mingo Amber had started her on metformin which gave her significant symptoms of diarrhea. She managed to take it for 3 weeks but did not feel like it was helping her. She is an avid runner, below store running club and runs several times a week. Her mom is a nutritionist and she feels like she is a fairly  healthy diet. 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 She has no symptoms of depression. Her menses have been much more regular and less painful since she was on oral contraceptives about 2 years ago.   Review of Systems Positive for unusual weight gain that has been fairly gradual but steady. Denies fever, sweats, chills, rash. No chest pain, no shortness of breath. Menses are regular. No anhedonia, no fatigue.    Objective:   Physical Exam  Vital signs reviewed GENERALl: Well developed, well nourished, in no acute distress. NECK: Supple, FROM, without lymphadenopathy.  THYROID: normal without nodularity CAROTID ARTERIES: without bruits LUNGS: clear to auscultation bilaterally. No wheezes or rales. HEART: Regular rate and rhythm, no murmurs ABDOMEN: soft with positive bowel sounds MSK: MOE x 4 SKIN no rash NEURO: no focal deficits       Assessment & Plan:

## 2012-06-04 ENCOUNTER — Encounter: Payer: Self-pay | Admitting: Family Medicine

## 2012-06-28 ENCOUNTER — Other Ambulatory Visit: Payer: Self-pay | Admitting: Family Medicine

## 2012-06-28 DIAGNOSIS — R635 Abnormal weight gain: Secondary | ICD-10-CM

## 2012-07-17 ENCOUNTER — Ambulatory Visit (INDEPENDENT_AMBULATORY_CARE_PROVIDER_SITE_OTHER): Payer: BC Managed Care – PPO | Admitting: Family Medicine

## 2012-07-17 ENCOUNTER — Encounter: Payer: Self-pay | Admitting: Family Medicine

## 2012-07-17 VITALS — Ht 66.0 in | Wt 179.6 lb

## 2012-07-17 DIAGNOSIS — R635 Abnormal weight gain: Secondary | ICD-10-CM

## 2012-07-17 NOTE — Progress Notes (Signed)
Medical Nutrition Therapy:  Appt start time: 0900 end time:  1000.  Assessment:  Primary concerns today: Weight management.  Leslie Bolton is very frustrated that she cannot manage to lose weight despite strong efforts to remain active and to limit high-fat and high-sugar foods.   Usual eating pattern includes 1-2 meals and 2-3 snacks per day. Usual physical activity includes running 20+ min plus at least 10 min walking with her dog, and horseback riding 2 hrs 1-2 X wk.  Frequent foods include seltzer water, Crystal Light, nuts, granola, yogurt, chx, quinoa, Detour bars, EAS protein shake 3-4 X wk.  Avoided foods include high-fat foods.   [name]'s eating pattern (minimal kcal early in the day) and her meal compositions (liquid kcal or protein bars as frequent meal replacements as well as inadequate veg's) are likely promoting weight gain.   [name] has a good knowledge of nutrition, but she has not made meal time and food planning and preparation a priority.    24-hr recall: (Up at 8 AM) B (9 AM)-   Detour bar (170 kcal), water, coffee w/ fat-free milk Snk ( AM)-   water L (12 PM)-  ~30 almonds Snk ( PM)-  none D (6:30 PM)-  Chx marsala, 1 c pasta, 1/2 bell pepper, ginger dressing, seltzer Snk ( PM)-  none Alternative breakfasts:  Granola or cereal w/ milk or yogurt; eggs with pepper  Progress Towards Goal(s):  In progress.   Nutritional Diagnosis:  [place]-3.3 Overweight/obesity As related to eating pattern and poor meal composition.  As evidenced by BMI of at least 29.    Intervention:  Nutrition counseling.  Monitoring/Evaluation:  Dietary intake, exercise, and body weight to be monitored by patient.  Weight check July 15 at noon.

## 2012-07-17 NOTE — Patient Instructions (Addendum)
-   Eat at least 3 meals and 1-2 snacks per day.  Aim for no more than 5 hours between eating.  - REAL MEALS:  Would you serve this as a meal to a guest in your home?  - Real lunch / dinner includes vegetables.    - When you feel hungry, that means EAT.   - Breakfast should be at least 400 calories and should include a good source of protein.   - Obtain twice as many veg's as protein or carbohydrate foods for both lunch and dinner. - Read "Sleep" handout, and aim for as many recommendations as you can manage.    - Daily food records for at least the next two weeks:  Include what and how much you eat, and what time.    - Review your food record:  What can I do better?  What allowed me to make good choices? - Weight training a couple times a week may be beneficial to your metabolic rate.   - FOCUS on the positive:  What you can DO to better manage your weight, not what you should not do.    - NOTE:  AT FOLLOW-UP, WE NEED TO MEASURE YOUR HEIGHT B/C YOUR CHART DIFFERS FROM YOUR ACCOUNT OF YOUR HEIGHT.

## 2012-08-15 ENCOUNTER — Other Ambulatory Visit: Payer: Self-pay | Admitting: Family Medicine

## 2012-10-17 ENCOUNTER — Ambulatory Visit: Payer: BC Managed Care – PPO | Admitting: Family Medicine

## 2012-11-08 ENCOUNTER — Telehealth: Payer: Self-pay | Admitting: Family Medicine

## 2012-11-08 MED ORDER — DESOGESTREL-ETHINYL ESTRADIOL 0.15-30 MG-MCG PO TABS: 1.0000 | ORAL_TABLET | Freq: Every day | ORAL | Status: DC

## 2012-11-08 NOTE — Telephone Encounter (Signed)
Pt would like [facility] prescription refilled [facility]  [address]

## 2012-11-08 NOTE — Telephone Encounter (Signed)
refilled 

## 2012-12-28 ENCOUNTER — Other Ambulatory Visit: Payer: Self-pay | Admitting: Family Medicine

## 2013-03-20 ENCOUNTER — Encounter: Payer: Self-pay | Admitting: Family Medicine

## 2013-03-20 ENCOUNTER — Ambulatory Visit (INDEPENDENT_AMBULATORY_CARE_PROVIDER_SITE_OTHER): Payer: BC Managed Care – PPO | Admitting: Family Medicine

## 2013-03-20 VITALS — BP 133/87 | HR 70 | Temp 98.3°F | Wt 180.0 lb

## 2013-03-20 DIAGNOSIS — F341 Dysthymic disorder: Secondary | ICD-10-CM

## 2013-03-20 DIAGNOSIS — Z309 Encounter for contraceptive management, unspecified: Secondary | ICD-10-CM

## 2013-03-20 DIAGNOSIS — R635 Abnormal weight gain: Secondary | ICD-10-CM

## 2013-03-20 MED ORDER — FLUOXETINE HCL 40 MG PO CAPS: 40.0000 mg | ORAL_CAPSULE | Freq: Every day | ORAL | Status: DC

## 2013-03-21 DIAGNOSIS — F341 Dysthymic disorder: Secondary | ICD-10-CM | POA: Insufficient documentation

## 2013-03-21 NOTE — Assessment & Plan Note (Signed)
We discussed contraceptive management. She also stopped OCPs her right now. She does not want to consider anything else for contraception.

## 2013-03-21 NOTE — Progress Notes (Signed)
   Subjective:    Patient ID: Leslie Bolton, female    DOB: 12/16/1990, 23 y.o.   MRN: [id number]  HPI Here for checkup. Due to scheduling issues we'll have to have her come back for her Pap  Continues to struggle with weight. She continues to exercise daily but has increased responsibilities hours at work so not doing as much as she would like. She is not in school this semester plans to go back in the summer. Last semester she was on fluoxetine her the doctor at the college. It was for some mild depressive symptoms. She had some lesser she restarted about a month ago partly for some mild depressive symptoms that were similar to those in the past but also partly in hopes that she would be able to maintain her weight better. She has no problems with this medicine. She's not had any suicidal or homicidal ideation. She's not had problems with sleep. She just felt a little flat, not as interested or is active as she would like to be.  Has been on oral contraceptives for regulation of her menstrual cycle and her acne. Acne is been much better all is although she occasionally has a small spot show up. She would like to go off all oral contraceptives now and see what happens. She's not currently sexually active. Review of Systems  Constitutional: Negative for fever, appetite change and fatigue.  HENT: Negative for ear pain.   Eyes: Negative for pain.  Respiratory: Negative for cough.   Cardiovascular: Negative for chest pain.  Gastrointestinal: Negative for abdominal pain.  Genitourinary: Negative for vaginal pain and pelvic pain.  Neurological: Negative for dizziness.  Psychiatric/Behavioral: Positive for dysphoric mood. Negative for hallucinations, confusion, decreased concentration and agitation. The patient is not nervous/anxious and is not hyperactive.        Objective:   Physical Exam  Constitutional: She is oriented to person, place, and time. She appears well-developed and  well-nourished.  Eyes: Conjunctivae and EOM are normal. Pupils are equal, round, and reactive to light.  Neck: No thyromegaly present.  Cardiovascular: Normal rate and regular rhythm.   Pulmonary/Chest: Effort normal and breath sounds normal.  Abdominal: Soft.  Neurological: She is alert and oriented to person, place, and time. She has normal reflexes.          Assessment & Plan:

## 2013-03-21 NOTE — Assessment & Plan Note (Signed)
Will increase her fluoxetine to 40 mg and see her back in about a month. I don't know if this will help her with her weight gain that may help her with some mild depressive symptoms which ultimately may help her recover wiggles well.

## 2013-08-06 ENCOUNTER — Encounter: Payer: Self-pay | Admitting: Family Medicine

## 2013-08-06 ENCOUNTER — Ambulatory Visit (INDEPENDENT_AMBULATORY_CARE_PROVIDER_SITE_OTHER): Payer: BC Managed Care – PPO | Admitting: Family Medicine

## 2013-08-06 VITALS — BP 123/85 | HR 67 | Temp 98.0°F | Ht 68.0 in | Wt 150.0 lb

## 2013-08-06 DIAGNOSIS — S29012A Strain of muscle and tendon of back wall of thorax, initial encounter: Secondary | ICD-10-CM

## 2013-08-06 DIAGNOSIS — S239XXA Sprain of unspecified parts of thorax, initial encounter: Secondary | ICD-10-CM

## 2013-08-06 MED ORDER — CYCLOBENZAPRINE HCL 10 MG PO TABS
10.0000 mg | ORAL_TABLET | Freq: Three times a day (TID) | ORAL | Status: DC | PRN
Start: 2013-08-06 — End: 2015-11-08

## 2013-08-06 MED ORDER — DICLOFENAC SODIUM 75 MG PO TBEC: 75.0000 mg | DELAYED_RELEASE_TABLET | Freq: Two times a day (BID) | ORAL | Status: DC

## 2013-08-06 NOTE — Progress Notes (Signed)
Patient ID: Leslie Bolton, female   DOB: Sep 19, 1990, 23 y.o.   MRN: [id number]    Subjective: HPI: Patient is a 23 y.o. female presenting to clinic today for rib pain. Concerns today include rib pain  1. Rib pain:  Patient presents with a 1.5 week h/o upper back and rib pain lateralized to the right side.  She states that she had been moving to a new apartment.  She states that the pain is worse with deep inhalation, bending and side to side movement.  She has used Motrin 400 mg twice daily and icy hot for relief.  These methods provide relief for about an hour. She denies OCP use or smoking.  Denies SOB, CP, or pain anywhere else.   History Reviewed: no smoker.  ROS: Please see HPI above.  Objective: Office vital signs reviewed. BP 123/85  Pulse 67  Temp(Src) 98 F (36.7 C) (Oral)  Ht 5' 8"  (1.727 m)  Wt 150 lb (68.04 kg)  BMI 22.81 kg/m2  Physical Examination:  General: Awake, alert, well nourished. NAD Pulm: CTAB, no wheezes, rhonchi or rales Cardio: RRR, no murmurs appreciated MSK: T8 flexed, rotated right, sidebent right. Decreased movement of Ribs 6-8 on right with inhalation. Ribs 6-8 tender to touch.  No gross bony abnormalities in spine or ribs Neuro: Strength and sensation grossly intact  PROCEDURE NOTE: Patient has given written consent for OMM.  Muscle energy of T8 performed (T8 FRRSR).  Rib raising of ribs 6-8 performed on right side.  These methods are indirect methods of relaxing the soft tissues and decreasing sympathetic signals.  Patient tolerated OMM well.  Assessment: 23 y.o. female with muscle strain of the right upper back.  Plan: See Problem List and After Visit Summary  Ashly M. Lajuana Ripple, DO

## 2013-08-06 NOTE — Patient Instructions (Signed)
It was such a pleasure meeting you today, Leslie Bolton!  I've sent in the medications we discussed.  Please make sure to eat with the diclofenac.  Also please remember that the cyclobenzaprine can cause you to be sleepy, so avoid taking this medication if you have to drive or operate heavy machinery.  Please call if symptoms get worse or you develop shortness of breath.  Best of luck with the new home! Muscle Strain A muscle strain is an injury that occurs when a muscle is stretched beyond its normal length. Usually a small number of muscle fibers are torn when this happens. Muscle strain is rated in degrees. First-degree strains have the least amount of muscle fiber tearing and pain. Second-degree and third-degree strains have increasingly more tearing and pain.  Usually, recovery from muscle strain takes 1-2 weeks. Complete healing takes 5-6 weeks.  CAUSES  Muscle strain happens when a sudden, violent force placed on a muscle stretches it too far. This may occur with lifting, sports, or a fall.  RISK FACTORS Muscle strain is especially common in athletes.  SIGNS AND SYMPTOMS At the site of the muscle strain, there may be:  Pain.  Bruising.  Swelling.  Difficulty using the muscle due to pain or lack of normal function. DIAGNOSIS  Your health care provider will perform a physical exam and ask about your medical history. TREATMENT  Often, the best treatment for a muscle strain is resting, icing, and applying cold compresses to the injured area.  HOME CARE INSTRUCTIONS   Use the PRICE method of treatment to promote muscle healing during the first 2-3 days after your injury. The PRICE method involves:  Protecting the muscle from being injured again.  Restricting your activity and resting the injured body part.  Icing your injury. To do this, put ice in a plastic bag. Place a towel between your skin and the bag. Then, apply the ice and leave it on from 15-20 minutes each hour. After the third  day, switch to moist heat packs.  Apply compression to the injured area with a splint or elastic bandage. Be careful not to wrap it too tightly. This may interfere with blood circulation or increase swelling.  Elevate the injured body part above the level of your heart as often as you can.  Only take over-the-counter or prescription medicines for pain, discomfort, or fever as directed by your health care provider.  Warming up prior to exercise helps to prevent future muscle strains. SEEK MEDICAL CARE IF:   You have increasing pain or swelling in the injured area.  You have numbness, tingling, or a significant loss of strength in the injured area. MAKE SURE YOU:   Understand these instructions.  Will watch your condition.  Will get help right away if you are not doing well or get worse. Document Released: 01/17/2005 Document Revised: 11/07/2012 Document Reviewed: 08/16/2012 [facility] Patient Information 2015 [place], [place]. This information is not intended to replace advice given to you by your health care provider. Make sure you discuss any questions you have with your health care provider.

## 2013-08-06 NOTE — Assessment & Plan Note (Addendum)
Based on HPI and exam, patient has strained upper back to include the R side of her ribcage. DDx pulmonary embolism -Patient given Flexeril 10 and Voltaren 17m to be used as needed. -Patient to continue to apply topical analgesic PRN and use warm compresses for muscle relief -Discussed with patient the sedating effects of flexeril and that she should eat with Voltaren -OMM of the ribs performed (muscle energy of the thoracics and rib raising- both are indirect methods) -Patient agreeable to plan and given literature on muscle strain -Patient to call office if symptoms do not improve or if she develops shortness of breath/chest pain

## 2013-08-19 ENCOUNTER — Encounter: Payer: Self-pay | Admitting: Family Medicine

## 2013-08-21 ENCOUNTER — Other Ambulatory Visit: Payer: Self-pay | Admitting: Family Medicine

## 2013-08-28 ENCOUNTER — Encounter: Payer: Self-pay | Admitting: Family Medicine

## 2013-08-28 ENCOUNTER — Ambulatory Visit (INDEPENDENT_AMBULATORY_CARE_PROVIDER_SITE_OTHER): Payer: BC Managed Care – PPO | Admitting: Family Medicine

## 2013-08-28 ENCOUNTER — Other Ambulatory Visit (HOSPITAL_COMMUNITY)
Admission: RE | Admit: 2013-08-28 | Discharge: 2013-08-28 | Disposition: A | Discharge status: Home / Self Care | Payer: BC Managed Care – PPO | Source: Ambulatory Visit | Attending: Family Medicine | Admitting: Family Medicine

## 2013-08-28 VITALS — BP 124/73 | HR 74 | Ht 68.0 in | Wt 153.6 lb

## 2013-08-28 DIAGNOSIS — Z124 Encounter for screening for malignant neoplasm of cervix: Secondary | ICD-10-CM

## 2013-08-28 DIAGNOSIS — Z309 Encounter for contraceptive management, unspecified: Secondary | ICD-10-CM

## 2013-08-28 DIAGNOSIS — Z01419 Encounter for gynecological examination (general) (routine) without abnormal findings: Secondary | ICD-10-CM | POA: Insufficient documentation

## 2013-08-28 MED ORDER — DESOGESTREL-ETHINYL ESTRADIOL 0.15-30 MG-MCG PO TABS: 1.0000 | ORAL_TABLET | Freq: Every day | ORAL | Status: DC

## 2013-08-28 NOTE — Progress Notes (Signed)
   Subjective:    Patient ID: Leslie Bolton, female    DOB: Nov 01, 1990, 23 y.o.   MRN: [id number]  HPI Here for her Pap smear and discussion about birth control. 5 months ago she stopped the birth control pills. After that she was successfully able to lose quite a bit of weight. She did have recurrence of some of her acne and her periods are now irregular again.   Review of Systems No vaginal pain or unusual discharge. No fever, sweats, chills.    Objective:   Physical Exam  Vital signs are reviewed General: Well-developed female no acute distress and GU: Externally normal. No adnexal masses or tenderness. Cervix appears normal. Pap smear is obtained.      Assessment & Plan:  Contraception and Pap smear today. We discussed options for contraception. Given her desire to improve her acne and regulate her cycle, oral contraceptives seem the best choice. She's a little concerned that she may gain weight on that. We discussed at length spending greater than 50% of our 40 minute office visit medication and counseling regarding birth control options. We discussed the nuva   which would also be a. Possibility for her but likely has less positive impact on her acne. Ultimately she decided to go back to the pill and see how she does with the weight. If she decides to change, she'll let me know.

## 2013-08-30 LAB — CYTOLOGY - PAP

## 2013-09-02 ENCOUNTER — Encounter: Payer: Self-pay | Admitting: Family Medicine

## 2013-09-26 ENCOUNTER — Other Ambulatory Visit: Payer: Self-pay | Admitting: Family Medicine

## 2013-10-31 ENCOUNTER — Encounter: Payer: Self-pay | Admitting: Family Medicine

## 2013-10-31 ENCOUNTER — Other Ambulatory Visit: Payer: Self-pay | Admitting: Family Medicine

## 2013-11-15 ENCOUNTER — Other Ambulatory Visit: Payer: Self-pay

## 2013-12-17 ENCOUNTER — Telehealth: Payer: Self-pay | Admitting: *Deleted

## 2013-12-17 NOTE — Telephone Encounter (Signed)
-----   Message from [name] to [name], MD sent at 12/16/2013 1:35 PM -----     Leslie Brilliant, do you happen to have any available appointments for next week? November 23rd-25th? Anytime during those days. I have those days off work. I am really interested in possibly changing birth control meds, and possibly changing antidepressants. I have noticed a consistent weight gain since being back on birth control and I felt a lot better when I was only taking antidepressants. Hopefully we can figure out a good balance of both. Thank you

## 2013-12-17 NOTE — Telephone Encounter (Signed)
[name] is going to call her so I can see her next Wed as requested THANKS! Leslie Bolton

## 2013-12-25 ENCOUNTER — Ambulatory Visit: Payer: BC Managed Care – PPO | Admitting: Family Medicine

## 2014-01-01 ENCOUNTER — Ambulatory Visit: Payer: BC Managed Care – PPO | Admitting: Family Medicine

## 2014-01-14 ENCOUNTER — Encounter: Payer: Self-pay | Admitting: Family Medicine

## 2014-01-14 ENCOUNTER — Telehealth: Payer: Self-pay | Admitting: *Deleted

## 2014-01-14 NOTE — Telephone Encounter (Signed)
-----   Message from [name] to [name], MD sent at 01/14/2014 6:39 AM -----     Leslie Bolton,    I am interested in switching birth controls to the copper IUD. Also I would like to continue taking the fluoxitine. Would this be ok to do? If it is ok can we schedule an appointment early January to get it done? I hope you have a Merry Christmas and good New Year!    Thank you!

## 2014-01-21 ENCOUNTER — Encounter: Payer: Self-pay | Admitting: Family Medicine

## 2014-02-19 ENCOUNTER — Ambulatory Visit (INDEPENDENT_AMBULATORY_CARE_PROVIDER_SITE_OTHER): Payer: BLUE CROSS/BLUE SHIELD | Admitting: Family Medicine

## 2014-02-19 ENCOUNTER — Encounter: Payer: Self-pay | Admitting: Family Medicine

## 2014-02-19 VITALS — BP 132/73 | HR 69 | Temp 98.1°F | Ht 68.0 in | Wt 172.7 lb

## 2014-02-19 DIAGNOSIS — Z30430 Encounter for insertion of intrauterine contraceptive device: Secondary | ICD-10-CM

## 2014-02-19 DIAGNOSIS — R638 Other symptoms and signs concerning food and fluid intake: Secondary | ICD-10-CM | POA: Diagnosis not present

## 2014-02-19 DIAGNOSIS — F341 Dysthymic disorder: Secondary | ICD-10-CM | POA: Diagnosis not present

## 2014-02-19 LAB — POCT URINE PREGNANCY: Preg Test, Ur: NEGATIVE

## 2014-02-19 MED ORDER — FLUOXETINE HCL 40 MG PO CAPS
40.0000 mg | ORAL_CAPSULE | Freq: Every day | ORAL | Status: DC
Start: 2014-02-19 — End: 2015-03-04

## 2014-02-19 NOTE — Assessment & Plan Note (Signed)
Leslie Bolton he test done today been partially we do not have the ParaGard in stock. We'll obtain one and  give her a call and we'll get her back in as soon as possible for insertion. We did discuss contraception, ocps , IUD in [place] specifically today.Greater than 50% of our 45 minute office visit was spent in counseling and education regarding these issues.

## 2014-02-19 NOTE — Assessment & Plan Note (Signed)
Discussed. Main reason she stopped that was when she was having some mood issues that she now thinks were related to the oral contraceptives. She still has some anxiety in the evenings and not sure if that is better or worse on the fluoxetine but since she started doing daily exercise such as running or biking at night she's done really well. She joined a running group and that has helped. She does want to restart fluoxetine. We'll restart her at 20 mg for 2 or 3 days that she still has some of those and then I'll call in a new prescription for 40 mg. I'll see her back in the next few days for her ParaGard. She'll otherwise follow-up for this once or twice a year and she's been pretty stable on this medicine.

## 2014-02-19 NOTE — Progress Notes (Signed)
   Subjective:    Patient ID: Leslie Bolton, female    DOB: 12-19-1990, 24 y.o.   MRN: [id number]  HPI  1 contraception. Wants to change from oral contraceptives to copper IUD. Really wants to get rid of the hormonal influence of oral contraceptives and does not want that in her IUD either. Thinks that the contraceptives are contributing to her weight gain and have some negative impact on her mood. #2. Dysthymia. She has done pretty well on fluoxetine 40. She says sometimes it does make her jittery so she's decided to work out every evening that seems to help. She stopped it 3 or 4 weeks ago when she was having some issues with her mood. She now thinks that was related to the oral contraceptives so she like to go back on fluoxetine at the 40 mg dose.  Review of Systems She has had some steady weight gain over the last year. No abdominal pain. Her periods have been regular. No dyspareunia. No vaginal discharge. No fever, sweats, chills. Mood was improved on fluoxetine, still has some occasional anxiety. No suicidal or homicidal ideation.    Objective:   Physical Exam Vital signs are reviewed GEN.: Well-developed female no acute distress PSYCHIATRIC: Alert and oriented 4. Affect is interactive. Speech is normal in content and fluency. Intact recent and remote memory. CV: Regular rate and rhythm Lungs: No accessory muscle use, normal respiratory effort.  Chart review: Last Pap smear July 2015 was normal.      Assessment & Plan:

## 2014-02-25 ENCOUNTER — Encounter: Payer: Self-pay | Admitting: Family Medicine

## 2014-02-27 NOTE — Telephone Encounter (Signed)
-----   Message from Leslie Bolton to [name], MD sent at 02/25/2014 2:14 PM -----     Kathe Mariner!     I was just wondering if you had figured out another time to have me in for the paragard. Just trying to schedule things on my calendar.

## 2014-03-13 ENCOUNTER — Ambulatory Visit (INDEPENDENT_AMBULATORY_CARE_PROVIDER_SITE_OTHER): Payer: BLUE CROSS/BLUE SHIELD | Admitting: Family Medicine

## 2014-03-13 VITALS — BP 131/77 | HR 83 | Temp 98.2°F | Wt 171.0 lb

## 2014-03-13 DIAGNOSIS — Z30430 Encounter for insertion of intrauterine contraceptive device: Secondary | ICD-10-CM | POA: Diagnosis not present

## 2014-03-13 LAB — POCT URINE PREGNANCY: Preg Test, Ur: NEGATIVE

## 2014-03-13 MED ORDER — PARAGARD INTRAUTERINE COPPER IU IUD
1.0000 | INTRAUTERINE_SYSTEM | Freq: Once | INTRAUTERINE | Status: AC
  Administered 2014-03-13: 1 via INTRAUTERINE

## 2014-03-13 NOTE — Progress Notes (Signed)
Patient ID: Leslie Bolton, female   DOB: [birth year], 25 y.o.   MRN: [id number] IUD INSERTION: Patient given informed consent, signed copy in the chart..  Negative pregnancy confirmed.  Appropriate time out taken.   Sterile instruments and technique was used. Cervix brought into view with use of speculum and then cleansed three times with  betadine swabs.  A tenaculum was placed into the anterior lip of the cervix and a uterine sound was used to measure uterine size.   A PARAGUARD IUD was placed into the endometrial cavity, deployed and secured. The applicator was removed. The strings were trimmed to 2 centimeters.   There were no complications and the patient tolerated the procedure well. Patient was able to feel the strings exiting the OS and was instructed to do string checks monthly.   She was given handouts for post procedure instructions and information about the IUD including a card with the time of recommended removal.

## 2014-03-13 NOTE — Addendum Note (Signed)
Addended by: Christen Bame D on: 03/13/2014 02:32 PM   Modules accepted: Orders

## 2014-10-21 ENCOUNTER — Encounter: Payer: Self-pay | Admitting: Family Medicine

## 2014-10-29 ENCOUNTER — Other Ambulatory Visit: Payer: Self-pay | Admitting: Family Medicine

## 2014-10-29 MED ORDER — CLINDAMYCIN PHOSPHATE 1 % EX SWAB: CUTANEOUS | Status: DC

## 2014-11-24 ENCOUNTER — Encounter: Payer: Self-pay | Admitting: Family Medicine

## 2015-03-04 ENCOUNTER — Other Ambulatory Visit: Payer: Self-pay | Admitting: *Deleted

## 2015-03-06 NOTE — Telephone Encounter (Signed)
2nd request.  Martin, Leslie L, RN  

## 2015-03-09 MED ORDER — FLUOXETINE HCL 40 MG PO CAPS: 40.0000 mg | ORAL_CAPSULE | Freq: Every day | ORAL | Status: DC

## 2015-11-04 ENCOUNTER — Encounter: Payer: Self-pay | Admitting: Family Medicine

## 2015-11-05 ENCOUNTER — Other Ambulatory Visit (HOSPITAL_COMMUNITY)
Admission: RE | Admit: 2015-11-05 | Discharge: 2015-11-05 | Disposition: A | Discharge status: Home / Self Care | Payer: 59 | Source: Ambulatory Visit | Attending: Family Medicine | Admitting: Family Medicine

## 2015-11-05 ENCOUNTER — Encounter: Payer: Self-pay | Admitting: Internal Medicine

## 2015-11-05 ENCOUNTER — Ambulatory Visit (INDEPENDENT_AMBULATORY_CARE_PROVIDER_SITE_OTHER): Payer: 59 | Admitting: Internal Medicine

## 2015-11-05 VITALS — BP 140/80 | HR 73 | Temp 98.0°F | Wt 170.0 lb

## 2015-11-05 DIAGNOSIS — N920 Excessive and frequent menstruation with regular cycle: Secondary | ICD-10-CM

## 2015-11-05 DIAGNOSIS — N939 Abnormal uterine and vaginal bleeding, unspecified: Secondary | ICD-10-CM

## 2015-11-05 DIAGNOSIS — Z113 Encounter for screening for infections with a predominantly sexual mode of transmission: Secondary | ICD-10-CM | POA: Diagnosis present

## 2015-11-05 DIAGNOSIS — R1030 Lower abdominal pain, unspecified: Secondary | ICD-10-CM | POA: Diagnosis not present

## 2015-11-05 LAB — POCT URINE PREGNANCY: PREG TEST UR: NEGATIVE

## 2015-11-05 NOTE — Progress Notes (Signed)
[facility] Progress Note  Subjective:  Leslie Bolton is a 24-y/o female who presents for SDA with abdominal pain, cramping, and vaginal bleeding x 1 day. She had spotting Monday and Tuesday but heavy bleeding began Thursday night. She normally has her period around the 20th of each month. She has a history of heavy menstrually bleeding but usually has a regular cycle. She has had paraguard for birth control since 03/13/14. She needed to change a pad and tampon about every hour overnight, then every 2 hours this afternoon. She has had pain in her R side with coughing (has had a cold for the past few days) as well as occasionally sharp, stabbing lower abdominal cramping. She does have cramping usually with her periods but this is more severe than usual. She has taken advil twice, which has helped somewhat. She was last sexually active last week. She has 1 female partner and also uses condoms for protection. Had a normal BM this morning. She denies increased vaginal discharge or recent dyspareunia. No dysuria or increased urinary frequency.  ROS: Denies fevers, n/v/d  No Known Allergies  Objective: Blood pressure 140/80, pulse 73, temperature 98 F (36.7 C), temperature source Oral, weight 170 lb (77.1 kg). Constitutional: Well-appearing female in NAD Cardiovascular: RRR, S1, S2, no m/r/g.  Pulmonary/Chest: Effort normal and breath sounds normal. No respiratory distress.  Abdominal: Soft. +BS, TTP over lower abdomen with deep palpation, no rebound or guarding.  MSK: No CVA tenderness.  GU: Minimal BRB with clear mucus in vaginal vault on speculum exam. Paraguard strings visualized. No lesions noted. No cervical motion tenderness on bimanual exam but patient with discomfort with deep palpation of R and LLQ.   Vitals reviewed  Assessment/Plan: Episode of heavy vaginal bleeding - AUB with dysmenorrhea. Upreg negative. Do not suspect ovarian torsion, as patient has pain with palpation  of both L and R LLQs. Paraguard strings visualized, so IUD still in place. - Recommended ibuprofen 600 mg daily until bleeding and pain improves. Recommended taking ibuprofen with start of period and continuing for 5 days.  - Recommended presumptive treatment for PID given lower abdominal discomfort but patient declined as she does not feel this is likely. Will wait for gc/chlamydia results.  - May need to consider restarting OCP if periods remain irregular, painful, and heavy.  - Provided strict return precautions.   Follow-up early next week if heavy bleeding continues. Go to Emergency Department if abdominal pain becomes more severe or develop fevers, inability to tolerate PO.   Olene Floss, MD [facility], PGY-2

## 2015-11-05 NOTE — Patient Instructions (Signed)
Ms. Monts,  Please take ibuprofen 600 mg until bleeding slows down. If heavy bleeding continues, please come back next week. If you have any dizziness or bleeding gets heavier, go to the emergency room.  If ibuprofen does not help, you may want to consider hormonal contraceptives.  Best, Dr. Ola Spurr

## 2015-11-08 DIAGNOSIS — N939 Abnormal uterine and vaginal bleeding, unspecified: Secondary | ICD-10-CM | POA: Insufficient documentation

## 2015-11-08 NOTE — Assessment & Plan Note (Signed)
-   AUB with dysmenorrhea. Upreg negative. Do not suspect ovarian torsion, as patient has pain with palpation of both L and R LLQs. Paraguard strings visualized, so IUD still in place. - Recommended ibuprofen 600 mg daily until bleeding and pain improves. Recommended taking ibuprofen with start of period and continuing for 5 days.  - Recommended presumptive treatment for PID given lower abdominal discomfort but patient declined as she does not feel this is likely. Will wait for gc/chlamydia results.  - May need to consider restarting OCP if periods remain irregular, painful, and heavy.  - Provided strict return precautions.

## 2015-11-09 LAB — GC/CHLAMYDIA PROBE AMP (~~LOC~~) NOT AT ARMC
CHLAMYDIA, DNA PROBE: NEGATIVE
Neisseria Gonorrhea: NEGATIVE

## 2015-11-25 ENCOUNTER — Encounter: Payer: Self-pay | Admitting: Family Medicine

## 2015-12-02 ENCOUNTER — Other Ambulatory Visit: Payer: Self-pay | Admitting: Family Medicine

## 2015-12-02 DIAGNOSIS — Z3041 Encounter for surveillance of contraceptive pills: Secondary | ICD-10-CM

## 2015-12-02 MED ORDER — DESOGESTREL-ETHINYL ESTRADIOL 0.15-30 MG-MCG PO TABS
1.0000 | ORAL_TABLET | Freq: Every day | ORAL | 3 refills | Status: DC
Start: 2015-12-02 — End: 2016-01-05

## 2016-01-04 ENCOUNTER — Encounter: Payer: Self-pay | Admitting: Family Medicine

## 2016-01-05 ENCOUNTER — Other Ambulatory Visit: Payer: Self-pay | Admitting: Family Medicine

## 2016-01-05 MED ORDER — NORETHIN ACE-ETH ESTRAD-FE 1-20 MG-MCG(24) PO TABS: 1.0000 | ORAL_TABLET | Freq: Every day | ORAL | 3 refills | Status: DC

## 2016-01-06 ENCOUNTER — Other Ambulatory Visit: Payer: Self-pay | Admitting: Family Medicine

## 2016-11-18 ENCOUNTER — Encounter: Payer: Self-pay | Admitting: Family Medicine

## 2016-11-18 ENCOUNTER — Ambulatory Visit (INDEPENDENT_AMBULATORY_CARE_PROVIDER_SITE_OTHER): Payer: BLUE CROSS/BLUE SHIELD | Admitting: Family Medicine

## 2016-11-18 VITALS — BP 110/76 | HR 67 | Temp 98.4°F | Ht 68.0 in | Wt 176.8 lb

## 2016-11-18 DIAGNOSIS — K921 Melena: Secondary | ICD-10-CM

## 2016-11-18 DIAGNOSIS — Z0184 Encounter for antibody response examination: Secondary | ICD-10-CM

## 2016-11-18 DIAGNOSIS — Z23 Encounter for immunization: Secondary | ICD-10-CM

## 2016-11-18 NOTE — Progress Notes (Signed)
    Subjective:  Leslie Bolton is a 26 y.o. female who presents to the [facility] today for check up to fill out paperwork.   HPI:  [name] presents today for a health assessment for work and to fill out some paper work. Her only concern is some intermittent blood in her stool about 1-2x/month that she has had for several years now along with crampy abdominal pain. She first noticed this about 2010. She had a CT abd that was concerning for infectious process vs: IBD. She never had it worked up beyond this. She denies any weakness, dizziness or syncopal episodes. Denies dysphagia, diarrhea, fevers, chills or weight loss.   PMH: dysthymic disorder with some anxiety associated Tobacco use: none Medication: reviewed and updated ROS: see HPI   Objective:  Physical Exam: BP 110/76   Pulse 67   Temp 98.4 F (36.9 C) (Oral)   Ht 5' 8"  (1.727 m)   Wt 176 lb 12.8 oz (80.2 kg)   LMP 11/07/2016   SpO2 99%   BMI 26.88 kg/m   Gen: 26yo F in NAD, resting comfortably CV: RRR with no murmurs appreciated Pulm: NWOB, CTAB with no crackles, wheezes, or rhonchi GI: Normal bowel sounds present. Soft, Nontender, Nondistended. MSK: no edema, cyanosis, or clubbing noted Skin: warm, dry Neuro: grossly normal, moves all extremities Psych: Normal affect and thought content  No results found for this or any previous visit (from the past 72 hour(s)).   Assessment/Plan:  Blood in stool Intermittent blood in stool 1-2/month with crampy abdominal pain. In 2010 had CT abd showing thickening in the illeum concerning for IBD vs: infectious process. Hgb WNL. No fevers, chills, weight loss or dysphagia.  - referred to GI for evaluation - return precautions discussed   Lucas Exline L. Rosalyn Gess, [place] Resident PGY-2 11/22/2016 7:11 AM

## 2016-11-18 NOTE — Patient Instructions (Signed)
Leslie Bolton, you were seen today for a check up.  I am concerned about your blood in your stool for the past several years and the CT scan you had in 2010 was concerning for possible crohns disease. I placed a referral to gastroenterology for them to work this up for you.   I am also checking your hemoglobin due to your heavy periods and the occasional blood in your stool. I will call you if there are any abnormal results, otherwise I will mail them. I am also checking a lab to see if you are immune to varicella.   Please follow up next week to have your PPD (TB test) placed and read.   Take care, Myrle Wanek L. Rosalyn Gess, [place] Resident PGY-2 11/18/2016 3:37 PM

## 2016-11-21 ENCOUNTER — Encounter: Payer: Self-pay | Admitting: Family Medicine

## 2016-11-21 LAB — VARICELLA ZOSTER ABS, IGG/IGM
Varicella IgM: <0.91 index (ref 0.00–0.90)
Varicella zoster IgG: 3656 index (ref >165)

## 2016-11-21 LAB — CBC
HEMOGLOBIN: 12.6 g/dL (ref 11.1–15.9)
Hematocrit: 38.2 % (ref 34.0–46.6)
MCH: 27.8 pg (ref 26.6–33.0)
MCHC: 33 g/dL (ref 31.5–35.7)
MCV: 84 fL (ref 79–97)
Platelets: 423 10*3/uL — ABNORMAL HIGH (ref 150–379)
RBC: 4.53 x10E6/uL (ref 3.77–5.28)
RDW: 14 % (ref 12.3–15.4)
WBC: 11.6 10*3/uL — ABNORMAL HIGH (ref 3.4–10.8)

## 2016-11-22 DIAGNOSIS — K921 Melena: Secondary | ICD-10-CM | POA: Insufficient documentation

## 2016-11-22 NOTE — Assessment & Plan Note (Addendum)
Intermittent blood in stool 1-2/month with crampy abdominal pain. In 2010 had CT abd showing thickening in the illeum concerning for IBD vs: infectious process. Hgb WNL. No fevers, chills, weight loss or dysphagia.  - referred to GI for evaluation - return precautions discussed

## 2017-02-03 ENCOUNTER — Encounter: Payer: Self-pay | Admitting: Family Medicine

## 2017-04-18 ENCOUNTER — Other Ambulatory Visit: Payer: Self-pay | Admitting: Family Medicine

## 2017-10-26 ENCOUNTER — Telehealth: Payer: Self-pay | Admitting: Gastroenterology

## 2017-10-26 ENCOUNTER — Encounter: Payer: Self-pay | Admitting: Gastroenterology

## 2017-10-26 ENCOUNTER — Other Ambulatory Visit (INDEPENDENT_AMBULATORY_CARE_PROVIDER_SITE_OTHER): Payer: 59

## 2017-10-26 ENCOUNTER — Ambulatory Visit (INDEPENDENT_AMBULATORY_CARE_PROVIDER_SITE_OTHER): Payer: 59 | Admitting: Gastroenterology

## 2017-10-26 VITALS — BP 108/70 | HR 71 | Ht 66.0 in | Wt 153.0 lb

## 2017-10-26 DIAGNOSIS — R1084 Generalized abdominal pain: Secondary | ICD-10-CM | POA: Diagnosis not present

## 2017-10-26 DIAGNOSIS — K921 Melena: Secondary | ICD-10-CM

## 2017-10-26 DIAGNOSIS — R197 Diarrhea, unspecified: Secondary | ICD-10-CM

## 2017-10-26 MED ORDER — NA SULFATE-K SULFATE-MG SULF 17.5-3.13-1.6 GM/177ML PO SOLN: 1.0000 | Freq: Once | ORAL | 0 refills | Status: AC

## 2017-10-26 NOTE — Telephone Encounter (Signed)
Patient requesting a work/school note be sent through [facility] for today's visit 9.26.19.

## 2017-10-26 NOTE — Patient Instructions (Addendum)
If you are age 29 or older, your body mass index should be between 23-30. Your Body mass index is 24.69 kg/m. If this is out of the aforementioned range listed, please consider follow up with your Primary Care Provider.  If you are age 65 or younger, your body mass index should be between 19-25. Your Body mass index is 24.69 kg/m. If this is out of the aformentioned range listed, please consider follow up with your Primary Care Provider.   You have been scheduled for a colonoscopy. Please follow written instructions given to you at your visit today.  Please pick up your prep supplies at the pharmacy within the next 1-3 days. If you use inhalers (even only as needed), please bring them with you on the day of your procedure. Your physician has requested that you go to www.startemmi.com and enter the access code given to you at your visit today. This web site gives a general overview about your procedure. However, you should still follow specific instructions given to you by our office regarding your preparation for the procedure.  Please go to the lab on the 2nd floor [address] before you leave the office today.   We have sent the following medications to your pharmacy for you to pick up at your convenience: Suprep  It was a pleasure to see you today!  Vito Cirigliano, D.O.

## 2017-10-26 NOTE — Progress Notes (Signed)
Chief Complaint: Hematochezia, nausea, abdominal pain   Referring Provider:    Self    HPI:     Leslie Bolton is a 27 y.o. female referred to the Gastroenterology Clinic for evaluation of a long-standing history of intermittent abdominal pain. She was seen at [facility] on 10/24/17. Vitals were stable and UA normal and was discharged to home.  She states she has been having intermittent lower abdominal pain for a number of years, dating back as far as 2010.  Pain is a cramping quality. Has had to miss work. Worse with popcorn but otherwise no exacerbating factors. Will typically last 15 mins, abate a bit, then flare again. Previously improved with Advil but otherwise no alleviating factors. Can last a few days. Does have associated hematochezia, including with this past episode described as BRB with some mucus-like stools. Does have chronically loose stools, but this worsens with her episodes.  Has not trialed any medications (aside from Advil as above) for her symptoms nor any fiber supplement or changes in diet.  No associated skin rashes, ophthalmologic symptoms, arthralgias.  No known family history of IBD.  Aside from the above normal UA, no recent labs or abdominal imaging for review.  However, she had a CT in November 2010 which was notable for thickening of the terminal ileum with otherwise normal-appearing colon and normal liver, gallbladder, pancreas.  FHx: Father- diverticulitis No known family history of CRC, GI malignancy, liver disease, pancreatic disease, or IBD.   PMHx: None  PSHx: Wisdom teeth extraction  Meds: None  All: NKDA   Social History   Tobacco Use  . Smoking status: Never Smoker  . Smokeless tobacco: Never Used  . Tobacco comment: tried it once   Substance Use Topics  . Alcohol use: Yes    Comment: once a week  . Drug use: Not Currently    Comment: have used marijuana for stomach issues   Current Outpatient Medications    Medication Sig Dispense Refill  . clindamycin (CLEOCIN T) 1 % SWAB APPLY TO FACE TWICE DAILY FOR ACNE 60 Package 3   No current facility-administered medications for this visit.    No Known Allergies   Review of Systems: All systems reviewed and negative except where noted in HPI.     Physical Exam:    Wt Readings from Last 3 Encounters:  10/26/17 153 lb (69.4 kg)  11/18/16 176 lb 12.8 oz (80.2 kg)  11/05/15 170 lb (77.1 kg)    Ht _0  (1.676 m)   Wt 153 lb (69.4 kg)   BMI 24.69 kg/m  Constitutional:  Pleasant, in no acute distress. Psychiatric: Normal mood and affect. Behavior is normal. EENT: Pupils normal.  Conjunctivae are normal. No scleral icterus. Neck supple. No cervical LAD. Cardiovascular: Normal rate, regular rhythm. No edema Pulmonary/chest: Effort normal and breath sounds normal. No wheezing, rales or rhonchi. Abdominal: Soft, nondistended.  Mild tenderness to deep palpation in bilateral lower quadrants, no rebound, guarding or peritoneal signs. Bowel sounds active throughout. There are no masses palpable. No hepatomegaly. Neurological: Alert and oriented to person place and time. Skin: Skin is warm and dry. No rashes noted.   ASSESSMENT AND PLAN;   Leslie Bolton is a 27 y.o. female presenting with a long-standing history of intermittent abdominal pain with associated diarrhea and hematochezia.  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  Clinical presentation  concerning for Crohn's disease.  Discussed complete DDX we will plan on evaluation as below:  - Colonoscopy with random and directed biopsies to evaluate for IBD, microscopic colitis, etc. - Given lack of UGI sxs, will hold off on EGD at this time - CBC, CMET, TSH, ESR, CRP - Given long duration of symptoms and overall clinical stability, we will hold off on trial of new medications in favor of diagnostic evaluation. -RTC after labs and colonoscopy complete.  The  indications, risks, and benefits of colonoscopy were explained to the patient in detail. Risks include but are not limited to bleeding, perforation, adverse reaction to medications, and cardiopulmonary compromise. Sequelae include but are not limited to the possibility of surgery, hospitalization, and mortality. The patient verbalized understanding and wished to proceed. All questions answered, referred to the scheduler and bowel prep ordered. Further recommendations pending results of the exam.     Lavena Bullion, DO, FACG  10/26/2017, 2:35 PM   [name], MD

## 2017-10-27 ENCOUNTER — Encounter: Payer: Self-pay | Admitting: Gastroenterology

## 2017-10-27 LAB — CBC WITH DIFFERENTIAL/PLATELET
BASOS PCT: 0.7 % (ref 0.0–3.0)
Basophils Absolute: 0 10*3/uL (ref 0.0–0.1)
Eosinophils Absolute: 0.1 10*3/uL (ref 0.0–0.7)
Eosinophils Relative: 1.7 % (ref 0.0–5.0)
HCT: 34.5 % — ABNORMAL LOW (ref 36.0–46.0)
Hemoglobin: 11.9 g/dL — ABNORMAL LOW (ref 12.0–15.0)
LYMPHS ABS: 2.1 10*3/uL (ref 0.7–4.0)
Lymphocytes Relative: 33 % (ref 12.0–46.0)
MCHC: 34.6 g/dL (ref 30.0–36.0)
MCV: 85.6 fl (ref 78.0–100.0)
MONOS PCT: 7.7 % (ref 3.0–12.0)
Monocytes Absolute: 0.5 10*3/uL (ref 0.1–1.0)
NEUTROS PCT: 56.9 % (ref 43.0–77.0)
Neutro Abs: 3.6 10*3/uL (ref 1.4–7.7)
PLATELETS: 336 10*3/uL (ref 150.0–400.0)
RBC: 4.03 Mil/uL (ref 3.87–5.11)
RDW: 13.1 % (ref 11.5–15.5)
WBC: 6.4 10*3/uL (ref 4.0–10.5)

## 2017-10-27 LAB — COMPREHENSIVE METABOLIC PANEL
ALT: 10 U/L (ref 0–35)
AST: 12 U/L (ref 0–37)
Albumin: 4.5 g/dL (ref 3.5–5.2)
Alkaline Phosphatase: 60 U/L (ref 39–117)
BUN: 13 mg/dL (ref 6–23)
CHLORIDE: 105 meq/L (ref 96–112)
CO2: 28 mEq/L (ref 19–32)
Calcium: 9.5 mg/dL (ref 8.4–10.5)
Creatinine, Ser: 0.76 mg/dL (ref 0.40–1.20)
GFR: 95.66 mL/min (ref >60.00)
GLUCOSE: 82 mg/dL (ref 70–99)
POTASSIUM: 3.7 meq/L (ref 3.5–5.1)
SODIUM: 141 meq/L (ref 135–145)
Total Bilirubin: 0.6 mg/dL (ref 0.2–1.2)
Total Protein: 7 g/dL (ref 6.0–8.3)

## 2017-10-27 LAB — SEDIMENTATION RATE: Sed Rate: 2 mm/hr (ref 0–20)

## 2017-10-27 LAB — TSH: TSH: 2.9 u[IU]/mL (ref 0.35–4.50)

## 2017-10-27 LAB — C-REACTIVE PROTEIN: CRP: 0.2 mg/dL — AB (ref 0.5–20.0)

## 2017-10-27 NOTE — Progress Notes (Signed)
Patients work excuse note has been sent via [place].

## 2017-11-08 ENCOUNTER — Encounter: Payer: Self-pay | Admitting: Gastroenterology

## 2017-11-08 ENCOUNTER — Ambulatory Visit (AMBULATORY_SURGERY_CENTER): Payer: 59 | Admitting: Gastroenterology

## 2017-11-08 VITALS — BP 118/72 | HR 55 | Temp 99.1°F | Resp 18 | Ht 66.0 in | Wt 153.0 lb

## 2017-11-08 DIAGNOSIS — R933 Abnormal findings on diagnostic imaging of other parts of digestive tract: Secondary | ICD-10-CM | POA: Diagnosis not present

## 2017-11-08 DIAGNOSIS — R197 Diarrhea, unspecified: Secondary | ICD-10-CM

## 2017-11-08 DIAGNOSIS — R1030 Lower abdominal pain, unspecified: Secondary | ICD-10-CM

## 2017-11-08 DIAGNOSIS — K56699 Other intestinal obstruction unspecified as to partial versus complete obstruction: Secondary | ICD-10-CM

## 2017-11-08 DIAGNOSIS — R194 Change in bowel habit: Secondary | ICD-10-CM

## 2017-11-08 MED ORDER — SODIUM CHLORIDE 0.9 % IV SOLN: 500.0000 mL | Freq: Once | INTRAVENOUS | Status: DC

## 2017-11-08 NOTE — Progress Notes (Signed)
Pt's states no medical or surgical changes since previsit or office visit. 

## 2017-11-08 NOTE — Progress Notes (Signed)
Called to room to assist during endoscopic procedure.  Patient ID and intended procedure confirmed with present staff. Received instructions for my participation in the procedure from the performing physician.  

## 2017-11-08 NOTE — Progress Notes (Signed)
Explained the CT and surgical referral to the patient and her husband.  They understand why the tests have to be done, and why surgery could be possible.  They understand, and will act accordingly.  No specific diet recommended.

## 2017-11-08 NOTE — Patient Instructions (Signed)
YOU HAD AN ENDOSCOPIC PROCEDURE TODAY AT [facility]:   Refer to the procedure report that was given to you for any specific questions about what was found during the examination.  If the procedure report does not answer your questions, please call your gastroenterologist to clarify.  If you requested that your care partner not be given the details of your procedure findings, then the procedure report has been included in a sealed envelope for you to review at your convenience later.  YOU SHOULD EXPECT: Some feelings of bloating in the abdomen. Passage of more gas than usual.  Walking can help get rid of the air that was put into your GI tract during the procedure and reduce the bloating. If you had a lower endoscopy (such as a colonoscopy or flexible sigmoidoscopy) you may notice spotting of blood in your stool or on the toilet paper. If you underwent a bowel prep for your procedure, you may not have a normal bowel movement for a few days.  Please Note:  You might notice some irritation and congestion in your nose or some drainage.  This is from the oxygen used during your procedure.  There is no need for concern and it should clear up in a day or so.  SYMPTOMS TO REPORT IMMEDIATELY:   Following lower endoscopy (colonoscopy or flexible sigmoidoscopy):  Excessive amounts of blood in the stool  Significant tenderness or worsening of abdominal pains  Swelling of the abdomen that is new, acute  Fever of 100F or higher   For urgent or emergent issues, a gastroenterologist can be reached at any hour by calling [phone].   DIET:  We do recommend a small meal at first, but then you may proceed to your regular diet.  Drink plenty of fluids but you should avoid alcoholic beverages for 24 hours. Do not eat a lot of fiber.  Drink plenty of water.  ACTIVITY:  You should plan to take it easy for the rest of today and you should NOT DRIVE or use heavy machinery until tomorrow (because  of the sedation medicines used during the test).    FOLLOW UP: Our staff will call the number listed on your records the next business day following your procedure to check on you and address any questions or concerns that you may have regarding the information given to you following your procedure. If we do not reach you, we will leave a message.  However, if you are feeling well and you are not experiencing any problems, there is no need to return our call.  We will assume that you have returned to your regular daily activities without incident.  If any biopsies were taken you will be contacted by phone or by letter within the next 1-3 weeks.  Please call us at [phone] if you have not heard about the biopsies in 3 weeks. The doctor will call you with results.     SIGNATURES/CONFIDENTIALITY: You and/or your care partner have signed paperwork which will be entered into your electronic medical record.  These signatures attest to the fact that that the information above on your After Visit Summary has been reviewed and is understood.  Full responsibility of the confidentiality of this discharge information lies with you and/or your care-partner.  Contrast given to you by recovery nurse.  The staff on the 3rd floor will call you to schedule the CT and the surgeon referral.  They should call you within the week.

## 2017-11-08 NOTE — Op Note (Signed)
[facility] Patient Name: Leslie Bolton Procedure Date: 11/08/2017 10:44 AM MRN: [id number] Endoscopist: Gerrit Heck , MD Age: 27 Referring MD:  Date of Birth: 12/18/1990 Gender: Female Account #: [id number] Procedure:                Colonoscopy Indications:              Lower abdominal pain, Abnormal CT of the GI tract,                            Change in bowel habits                           27 yo female with a long standing history of                            intermittent lower abdominal pain, abdominal                            cramping along with mucus-like stools and chronic                            loose stools, exacerbated with times of abdominal                            pain. CT in 2010 notable for terminal ileal                            thickening. Recent labs otherwise normal, to                            include CBC, CMP, ESR, CRP. Medicines:                Monitored Anesthesia Care Procedure:                Pre-Anesthesia Assessment:                           - Prior to the procedure, a History and Physical                            was performed, and patient medications and                            allergies were reviewed. The patient's tolerance of                            previous anesthesia was also reviewed. The risks                            and benefits of the procedure and the sedation                            options and risks were discussed with the patient.  All questions were answered, and informed consent                            was obtained. Prior Anticoagulants: The patient has                            taken no previous anticoagulant or antiplatelet                            agents. ASA Grade Assessment: II - A patient with                            mild systemic disease. After reviewing the risks                            and benefits, the patient was deemed in    satisfactory condition to undergo the procedure.                           After obtaining informed consent, the colonoscope                            was passed under direct vision. Throughout the                            procedure, the patient's blood pressure, pulse, and                            oxygen saturations were monitored continuously. The                            Colonoscope was introduced through the anus and                            advanced to the the rectum. The colonoscopy was                            technically difficult and complex due to stenosis.                            Successful completion of the procedure was aided by                            withdrawing the scope and replacing with the adult                            endoscope. The patient tolerated the procedure                            well. The quality of the bowel preparation was                            adequate. Scope In: 10:54:11 AM Scope Out: 11:23:46 AM Total Procedure Duration:  0 hours 29 minutes 35 seconds  Findings:                 The perianal and digital rectal examinations were                            normal.                           A benign-appearing, intrinsic severe stenosis                            measuring 5 mm (inner diameter) was found in the                            recto-sigmoid colon and was non-traversed. This was                            located 20 cm from the anal verge. The overlying                            mucosa was normal appearing. No erythema, erosions,                            or ulceration. The scope was withdrawn and replaced                            with the adult endoscope in order to accomplish the                            maneuver. Still unable to traverse. Attempted to                            widen luminal diameter using a closed forceps, btu                            only able to briefly visualize proximal mucosa.                             This appeared normal on very limited views.                            Biopsies were then taken with a cold forceps for                            histology. Estimated blood loss was minimal. Area                            1-2 cm distal to the stenosis was tattooed with an                            injection of 5 mL of Spot (carbon black).  The rectum appeared normal.                           The retroflexed view of the distal rectum and anal                            verge was normal and showed no anal or rectal                            abnormalities. Complications:            No immediate complications. Estimated Blood Loss:     Estimated blood loss was minimal. 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 This could not be traversed                            despite changing to a smaller-caliber endoscope.                            Biopsied. Tattooed.                           - The rectum is normal.                           - The distal rectum and anal verge are normal on                            retroflexion view. Recommendation:           - Patient has a contact number available for                            emergencies. The signs and symptoms of potential                            delayed complications were discussed with the                            patient. Return to normal activities tomorrow.                            Written discharge instructions were provided to the                            patient.                           - Resume previous diet.                           - Continue present medications.                           -  Await pathology results.                           - Refer to a colo-rectal surgeon at appointment to                            be scheduled.                           - Perform a computed tomographic (CT  scan)                            enterography at the next available appointment.                           - Return to GI clinic after studies are complete. Gerrit Heck, MD 11/08/2017 11:37:12 AM

## 2017-11-08 NOTE — Progress Notes (Signed)
Report given to PACU, vss 

## 2017-11-09 ENCOUNTER — Telehealth: Payer: Self-pay

## 2017-11-09 NOTE — Telephone Encounter (Signed)
  Follow up Call-  Call back number 11/08/2017  Post procedure Call Back phone  # [phone]  Permission to leave phone message Yes  Some recent data might be hidden     Patient questions:  Do you have a fever, pain , or abdominal swelling? No. Pain Score  0 *  Have you tolerated food without any problems? Yes.    Have you been able to return to your normal activities? Yes.    Do you have any questions about your discharge instructions: Diet   No. Medications  No. Follow up visit  No.  Do you have questions or concerns about your Care? No.  Actions: * If pain score is 4 or above: No action needed, pain <4.

## 2017-11-09 NOTE — Telephone Encounter (Signed)
  Follow up Call-  Call back number 11/08/2017  Post procedure Call Back phone  # [phone]  Permission to leave phone message Yes  Some recent data might be hidden     Left message

## 2017-11-10 ENCOUNTER — Other Ambulatory Visit: Payer: Self-pay

## 2017-11-10 DIAGNOSIS — R933 Abnormal findings on diagnostic imaging of other parts of digestive tract: Secondary | ICD-10-CM

## 2017-11-10 DIAGNOSIS — R194 Change in bowel habit: Secondary | ICD-10-CM

## 2017-11-10 DIAGNOSIS — K56699 Other intestinal obstruction unspecified as to partial versus complete obstruction: Secondary | ICD-10-CM

## 2017-11-10 DIAGNOSIS — R1030 Lower abdominal pain, unspecified: Secondary | ICD-10-CM

## 2017-11-10 NOTE — Progress Notes (Addendum)
Patient notified of CT scan scheduled for 11/23/17 4:00.  I sent her a myChart message with the details. She is advised that she will get a call from [facility] to schedule her appt directly.         Patient has been scheudled to see Dr. Johney Maine at [facility] on 11/28/17 10:15

## 2017-11-22 ENCOUNTER — Ambulatory Visit (INDEPENDENT_AMBULATORY_CARE_PROVIDER_SITE_OTHER)
Admission: RE | Admit: 2017-11-22 | Discharge: 2017-11-22 | Disposition: A | Discharge status: Home / Self Care | Payer: 59 | Source: Ambulatory Visit | Attending: Gastroenterology | Admitting: Gastroenterology

## 2017-11-22 DIAGNOSIS — R933 Abnormal findings on diagnostic imaging of other parts of digestive tract: Secondary | ICD-10-CM | POA: Diagnosis not present

## 2017-11-22 DIAGNOSIS — R1030 Lower abdominal pain, unspecified: Secondary | ICD-10-CM

## 2017-11-22 DIAGNOSIS — K56699 Other intestinal obstruction unspecified as to partial versus complete obstruction: Secondary | ICD-10-CM

## 2017-11-22 DIAGNOSIS — R194 Change in bowel habit: Secondary | ICD-10-CM

## 2017-11-22 MED ORDER — IOPAMIDOL (ISOVUE-300) INJECTION 61%
100.0000 mL | Freq: Once | INTRAVENOUS | Status: AC | PRN
  Administered 2017-11-22: 100 mL via INTRAVENOUS

## 2017-11-23 ENCOUNTER — Other Ambulatory Visit: Payer: 59

## 2017-11-23 ENCOUNTER — Inpatient Hospital Stay: Admission: RE | Admit: 2017-11-23 | Payer: 59 | Source: Ambulatory Visit

## 2017-11-24 ENCOUNTER — Other Ambulatory Visit: Payer: Self-pay | Admitting: Family Medicine

## 2017-11-28 ENCOUNTER — Ambulatory Visit (INDEPENDENT_AMBULATORY_CARE_PROVIDER_SITE_OTHER): Payer: 59 | Admitting: Gastroenterology

## 2017-11-28 ENCOUNTER — Other Ambulatory Visit (INDEPENDENT_AMBULATORY_CARE_PROVIDER_SITE_OTHER): Payer: 59

## 2017-11-28 ENCOUNTER — Encounter: Payer: Self-pay | Admitting: Gastroenterology

## 2017-11-28 VITALS — BP 106/78 | HR 53 | Ht 66.0 in | Wt 153.2 lb

## 2017-11-28 DIAGNOSIS — K56699 Other intestinal obstruction unspecified as to partial versus complete obstruction: Secondary | ICD-10-CM

## 2017-11-28 DIAGNOSIS — K50118 Crohn's disease of large intestine with other complication: Secondary | ICD-10-CM

## 2017-11-28 NOTE — Progress Notes (Signed)
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  Pain is a cramping quality. Has had to miss work. Worse with popcorn but otherwise no exacerbating factors. Will typically last 15 mins, abate a bit, then flare again. Previously improved with Advil but otherwise no alleviating factors. Can last a few days. Does have associated hematochezia, including with recent recurrence of symptoms described as BRB with some mucus-like stools. Does have chronically loose stools, but this worsens with her episodes.  Has not trialed any medications (aside from Advil as above) for her symptoms nor any fiber supplement or changes in diet.  No associated skin rashes, ophthalmologic symptoms, arthralgias.  No known family history of IBD.  HPI:     Patient is a 27 y.o. female with recently diagnosed stricturing type colonic Crohns Disease, presenting to the Gastroenterology Clinic for follow-up and initial discussion of dx and recent colonoscopy and CT findings. She states that her sxs are essentially unchanged from her previous appt on 10/26/17. Colonoscopy completed 11/08/17 and notable for severe stricture located 20 cm from anal verge which was non-traversable despite changing to an upper endoscope.  Biopsies notable for chronic inactive colitis.  Was then referred for a CT which similarly demonstrated a long segment of mucosal thickening in the distal sigmoid colon.  She was seen by Dr. gross, colorectal surgery, earlier today, and presents to the GI clinic for routine follow-up and discussion of recent findings.  Today, she states she is otherwise in her usual state of health.  Continues to have intermittent GI symptoms.  IBD History:  Stricturing type colonic Crohn's Disease   Evaluation to date:  - TPMT: Deferred for now - TB  testing: Ordered today - HBV status: Ordered today - Pertinent Imaging: CT (10/2017): Long segment of mucosal thickening in the distal sigmoid colon and rectum, mild pericolonic inflammatory changes in the surrounding mesentery without abscess.  Normal-appearing small bowel. - Last colonoscopy: 10/2017 - Small bowel imaging: Other than CT this month, no dedicated small bowel imaging - History of EIMs: None  Medications to date: None  Health Maintenance:  - DEXA: N/A - Vaccinations:      - Annual Flu Vaccine -obtaining this month      - Pneumococcal Vaccine if receiving immunosuppression: N/A  - Micronutrient eval:       - Annual Vit D, B6, iron panel: Ordered today - Annual Pap (if immunosuppressed):  - Surveillance colonoscopy:  - Surveillance labs for immunomodulators: N/A - Annual depression screening: None - Annual Dermatology/Skin exam: Will schedule  Endoscopic Hx: -Colonoscopy (11/08/2017): Benign-appearing, intrinsic severe stenosis measuring 5 mm (inner diameter) was found in the recto-sigmoid colon and was non-traversed. This was located 20 cm from the anal verge. The overlying mucosa was normal appearing.  Biopsies demonstrated inactive chronic colitis.  Normal-appearing rectum.   Review of systems:     No chest pain, no SOB, no fevers, no urinary sx   Past Medical History:  Diagnosis Date  . Anxiety   . Depression     Patient's surgical history, family medical history, social history, medications and allergies were all reviewed in Epic    Current Outpatient Medications  Medication Sig Dispense Refill  . clindamycin (CLEOCIN T) 1 % SWAB APPLY TO FACE TWICE DAILY FOR ACNE 60 Package 2   No  current facility-administered medications for this visit.     Physical Exam:     BP 106/78   Pulse (!) 53   Ht 5' 6"  (1.676 m)   Wt 153 lb 4 oz (69.5 kg)   BMI 24.74 kg/m   GENERAL:  Pleasant female in NAD PSYCH: : Cooperative, normal affect EENT:  conjunctiva pink,  mucous membranes moist, neck supple without masses ABDOMEN:  Nondistended, soft, nontender. No obvious masses, no hepatomegaly,  normal bowel sounds SKIN:  turgor, no lesions seen Musculoskeletal:  Normal muscle tone, normal strength NEURO: Alert and oriented x 3, no focal neurologic deficits   IMPRESSION and PLAN:    #1.  Colonic Crohn's disease: Recent diagnosis of stricturing type colonic Crohn's disease with a high-grade stenosis in the distal sigmoid colon which was non-traversable.  Had a long discussion of Crohn's disease at length with the patient today to include alternate etiologies for her stenosis.  Discussed the pathophysiology of Crohn's disease as well as medical and surgical options.  Given the relatively normal-appearing mucosa overlying the stenosis in the inactive inflammatory changes noted on the biopsies, along with the nature of her symptoms, normal inflammatory markers, CT findings, I am suspicious that she will not respond to a trial of immunosuppressive therapy/anti-inflammatory medications, and instead require surgical intervention.  She met with Dr. Johney Maine earlier today and I will plan on discussing this with him to get his input and thoughts as well.  Given her clinical stability we discussed considering a trial of Entyvio and reevaluating for clinical and luminal improvement.  While she finds value in this she also is interested in a surgical intervention so not to delay what seems to be the inevitable and for more rapid restoration of her baseline health.  - I will discuss further with Dr. Johney Maine - We will follow-up with the patient regarding treatment options - In the meantime we will send for premedication labs along with micronutrient evaluation -Yearly dermatology eval - Planning on getting flu vaccine - Would avoid live vaccines at this time as were entertaining immunosuppressive therapy - After reviewing all medication options, if she is to pursue at least a trial  of medical management, she seems to be leaning towards Entyvio versus Cimzia (favorable when considering plans of future pregnancy, but certainly cautious given less favorable side effect profile when compared with Entyvio)    #2.  Colonic stricture: Secondary to the above underlying Crohn's disease and will proceed as above.  In the meantime:  - Low residue diet -Resume adequate hydration   I spent a total of 25 minutes of face-to-face time with the patient. Greater than 50% of the time was spent counseling and coordinating care.   Cc: Michael Boston, MD, [place] ,DO, [facility] 11/28/2017, 2:11 PM

## 2017-11-28 NOTE — Patient Instructions (Addendum)
Recommend viewing [email or web address] for additional information regarding Crohns Disease.   If you are age 29 or older, your body mass index should be between 23-30. Your Body mass index is 24.74 kg/m. If this is out of the aforementioned range listed, please consider follow up with your Primary Care Provider.  If you are age 59 or younger, your body mass index should be between 19-25. Your Body mass index is 24.74 kg/m. If this is out of the aformentioned range listed, please consider follow up with your Primary Care Provider.   Please go to the lab on the 2nd floor [address] before you leave the office today.   It was a pleasure to see you today!  Leslie Bolton, D.O.

## 2017-11-29 LAB — B12 AND FOLATE PANEL
FOLATE: 22 ng/mL (ref >5.9)
Vitamin B-12: 351 pg/mL (ref 211–911)

## 2017-11-29 LAB — HEPATITIS B SURFACE ANTIGEN: HEP B S AG: NONREACTIVE

## 2017-11-29 LAB — FERRITIN: FERRITIN: 21 ng/mL (ref 10.0–291.0)

## 2017-11-29 LAB — HEPATITIS B SURFACE ANTIBODY,QUALITATIVE: Hep B S Ab: REACTIVE — AB

## 2017-11-29 LAB — VITAMIN D 25 HYDROXY (VIT D DEFICIENCY, FRACTURES): VITD: 27.97 ng/mL — ABNORMAL LOW (ref 30.00–100.00)

## 2017-11-30 LAB — QUANTIFERON-TB GOLD PLUS
Mitogen-NIL: >10 IU/mL
NIL: 0.02 [IU]/mL
QUANTIFERON-TB GOLD PLUS: NEGATIVE
TB1-NIL: 0 IU/mL
TB2-NIL: 0 IU/mL

## 2017-12-01 ENCOUNTER — Telehealth: Payer: Self-pay | Admitting: Gastroenterology

## 2017-12-01 NOTE — Telephone Encounter (Signed)
Pt called stating that Dr. Bryan Lemma sent her a message through my chart asking her to call the office. However, I was not able to find that message.

## 2017-12-06 ENCOUNTER — Telehealth: Payer: Self-pay

## 2017-12-06 NOTE — Telephone Encounter (Signed)
Referred patient to [facility] for Entyvio Infusions.

## 2017-12-08 ENCOUNTER — Telehealth: Payer: Self-pay

## 2017-12-08 NOTE — Telephone Encounter (Signed)
-----   Message from Roxine Caddy, [place] sent at 12/08/2017  9:17 AM EST ----- I spoke to Garland at [facility] and she said they did receive the referral and are in the process of trying to get the [facility] now. Then she will be scheduled.  ----- Message ----- From: Marlon Pel, RN Sent: 12/07/2017   5:19 PM EST To: Roxine Caddy, CMA  Thank you .  Will you let me know when you hear back. Thanks for the heads up.  Sheri ----- Message ----- From: Roxine Caddy, CMA Sent: 12/06/2017  12:18 PM EST To: Marlon Pel, RN  Faxed Labs, OV, and [place] order with starting and maintenance dose instructions to [facility].

## 2017-12-08 NOTE — Telephone Encounter (Signed)
Called and spoke with pt-informed pt of referral being received and medication being ordered.  [facility] once the medication is obtained in order to schedule the infusion. Pt verbalized understanding and is agreeable with plan of care;  Pt advised to call back if questions/concerns arise.

## 2017-12-13 NOTE — Telephone Encounter (Signed)
Called and spoke to [facility] -at this time, they are in the process of getting the medication approved by insurance-the first appt has not been scheduled yet; they have not called and updated the patient on the status of the approval at this time;

## 2017-12-19 ENCOUNTER — Telehealth: Payer: Self-pay

## 2017-12-19 NOTE — Telephone Encounter (Signed)
Information received from insurance company of denial for Petrey; Insurance is stating the medical necessity criteria were not met;  Please advise on next step in plan of care; Side note:Unsure if medication class will be approved/not even if alternative medication ordered

## 2017-12-19 NOTE — Telephone Encounter (Signed)
There is a peer to peer number you can call; I still don't know if they will approve it due to her insurance being what it is; The number is [phone]; please let me know if you need me to do anything else;

## 2017-12-19 NOTE — Telephone Encounter (Signed)
How do they come to this conclusion??? She has Crohns Disease and needs immunosuppressive therapy. This is as clear an indication as exists and I chose a first line medication. Do I need to contact someone or set up a peer to peer? Thanks.

## 2017-12-20 ENCOUNTER — Telehealth: Payer: Self-pay

## 2017-12-20 NOTE — Telephone Encounter (Signed)
I spoke to Delman Kitten our [place] Rep who in the past has helped [place] to get people approved that insurance tried to deny. She is going to get on it and call me back with an update tomorrow, possibly later today.

## 2017-12-20 NOTE — Telephone Encounter (Signed)
Called and spoke with [facility]; I tried to get the patient's infusion appt scheduled, however, they are requesting the approval paperwork be faxed to them prior to them scheduling the patient for the infusion; do we have this paperwork or would you need to call the insurance company and request they fax it to rheumatology [phone]; please let me know if there is anything else I need to do to help;

## 2017-12-20 NOTE — Telephone Encounter (Signed)
Dr. Bryan Lemma was able to get [facility] approved after doing the Peer to Peer. Patient has been notified.

## 2017-12-20 NOTE — Telephone Encounter (Signed)
Thank you for handling this-it is a big help, not just to me, but also the patient; let me know if there is any thing I need to do with this;

## 2017-12-21 NOTE — Telephone Encounter (Signed)
If you receive any paperwork on the approval of the Entyvio please remember to fax it to rheumatology as they will not schedule this patient's infusion until they receive it.

## 2017-12-21 NOTE — Telephone Encounter (Signed)
Spoke to [place] at [employer] has been received by [facility]. The patient is scheduled for 12/27/2017 at 9:30am. Patient is aware.

## 2017-12-21 NOTE — Telephone Encounter (Signed)
We have not received this paperwork as of now. However I have been on the phone with her insurance company trying to track it down. Whenever I get it I will fax it to [place]. Thank you for your help.

## 2018-01-23 ENCOUNTER — Encounter (HOSPITAL_COMMUNITY): Payer: Self-pay | Admitting: Emergency Medicine

## 2018-01-23 ENCOUNTER — Emergency Department (HOSPITAL_COMMUNITY): Payer: 59

## 2018-01-23 ENCOUNTER — Emergency Department (HOSPITAL_COMMUNITY)
Admission: EM | Admit: 2018-01-23 | Discharge: 2018-01-23 | Disposition: A | Discharge status: Home / Self Care | Payer: 59 | Attending: Emergency Medicine | Admitting: Emergency Medicine

## 2018-01-23 DIAGNOSIS — K5900 Constipation, unspecified: Secondary | ICD-10-CM | POA: Diagnosis not present

## 2018-01-23 DIAGNOSIS — K50119 Crohn's disease of large intestine with unspecified complications: Secondary | ICD-10-CM | POA: Insufficient documentation

## 2018-01-23 DIAGNOSIS — R1084 Generalized abdominal pain: Secondary | ICD-10-CM | POA: Diagnosis present

## 2018-01-23 DIAGNOSIS — Z79899 Other long term (current) drug therapy: Secondary | ICD-10-CM | POA: Insufficient documentation

## 2018-01-23 HISTORY — DX: Crohn's disease, unspecified, without complications: K50.90

## 2018-01-23 LAB — CBC WITH DIFFERENTIAL/PLATELET
Abs Immature Granulocytes: 0.02 10*3/uL (ref 0.00–0.07)
Basophils Absolute: 0 10*3/uL (ref 0.0–0.1)
Basophils Relative: 0 %
Eosinophils Absolute: 0 10*3/uL (ref 0.0–0.5)
Eosinophils Relative: 0 %
HCT: 37.2 % (ref 36.0–46.0)
Hemoglobin: 12.1 g/dL (ref 12.0–15.0)
IMMATURE GRANULOCYTES: 0 %
LYMPHS PCT: 20 %
Lymphs Abs: 1.8 10*3/uL (ref 0.7–4.0)
MCH: 27.9 pg (ref 26.0–34.0)
MCHC: 32.5 g/dL (ref 30.0–36.0)
MCV: 85.7 fL (ref 80.0–100.0)
Monocytes Absolute: 0.5 10*3/uL (ref 0.1–1.0)
Monocytes Relative: 6 %
Neutro Abs: 6.5 10*3/uL (ref 1.7–7.7)
Neutrophils Relative %: 74 %
Platelets: 315 10*3/uL (ref 150–400)
RBC: 4.34 MIL/uL (ref 3.87–5.11)
RDW: 12.1 % (ref 11.5–15.5)
WBC: 8.9 10*3/uL (ref 4.0–10.5)
nRBC: 0 % (ref 0.0–0.2)

## 2018-01-23 LAB — I-STAT BETA HCG BLOOD, ED (MC, WL, AP ONLY): I-stat hCG, quantitative: <5 m[IU]/mL (ref <5)

## 2018-01-23 LAB — COMPREHENSIVE METABOLIC PANEL
ALT: 11 U/L (ref 0–44)
AST: 15 U/L (ref 15–41)
Albumin: 4.5 g/dL (ref 3.5–5.0)
Alkaline Phosphatase: 49 U/L (ref 38–126)
Anion gap: 10 (ref 5–15)
BUN: 15 mg/dL (ref 6–20)
CO2: 21 mmol/L — ABNORMAL LOW (ref 22–32)
Calcium: 9.5 mg/dL (ref 8.9–10.3)
Chloride: 108 mmol/L (ref 98–111)
Creatinine, Ser: 0.83 mg/dL (ref 0.44–1.00)
GFR calc Af Amer: >60 mL/min (ref >60)
GFR calc non Af Amer: >60 mL/min (ref >60)
Glucose, Bld: 89 mg/dL (ref 70–99)
Potassium: 3.5 mmol/L (ref 3.5–5.1)
Sodium: 139 mmol/L (ref 135–145)
Total Bilirubin: 0.9 mg/dL (ref 0.3–1.2)
Total Protein: 7.1 g/dL (ref 6.5–8.1)

## 2018-01-23 LAB — LIPASE, BLOOD: Lipase: 21 U/L (ref 11–51)

## 2018-01-23 MED ORDER — PREDNISONE 20 MG PO TABS: 40.0000 mg | ORAL_TABLET | Freq: Every day | ORAL | 0 refills | Status: AC

## 2018-01-23 MED ORDER — IOHEXOL 300 MG/ML  SOLN
100.0000 mL | Freq: Once | INTRAMUSCULAR | Status: AC | PRN
  Administered 2018-01-23: 100 mL via INTRAVENOUS

## 2018-01-23 MED ORDER — MORPHINE SULFATE (PF) 4 MG/ML IV SOLN
4.0000 mg | Freq: Once | INTRAVENOUS | Status: AC
  Administered 2018-01-23: 4 mg via INTRAVENOUS
  Filled 2018-01-23: qty 1

## 2018-01-23 MED ORDER — DICYCLOMINE HCL 10 MG/ML IM SOLN
20.0000 mg | Freq: Once | INTRAMUSCULAR | Status: AC
  Administered 2018-01-23: 20 mg via INTRAMUSCULAR
  Filled 2018-01-23: qty 2

## 2018-01-23 MED ORDER — SODIUM CHLORIDE 0.9 % IV BOLUS
1000.0000 mL | Freq: Once | INTRAVENOUS | Status: AC
  Administered 2018-01-23: 1000 mL via INTRAVENOUS

## 2018-01-23 MED ORDER — ONDANSETRON 4 MG PO TBDP: ORAL_TABLET | ORAL | 0 refills | Status: DC

## 2018-01-23 MED ORDER — DICYCLOMINE HCL 10 MG PO CAPS: 10.0000 mg | ORAL_CAPSULE | Freq: Three times a day (TID) | ORAL | 0 refills | Status: DC | PRN

## 2018-01-23 MED ORDER — ONDANSETRON HCL 4 MG/2ML IJ SOLN
4.0000 mg | Freq: Once | INTRAMUSCULAR | Status: AC
  Administered 2018-01-23: 4 mg via INTRAVENOUS
  Filled 2018-01-23: qty 2

## 2018-01-23 MED ORDER — PREDNISONE 20 MG PO TABS
40.0000 mg | ORAL_TABLET | Freq: Once | ORAL | Status: AC
  Administered 2018-01-23: 40 mg via ORAL
  Filled 2018-01-23: qty 2

## 2018-01-23 NOTE — ED Notes (Signed)
Pt verbalized understanding discharge instructions and denies any further needs or questions at this time. VS stable, ambulatory and steady gait.   See EDP assessment / note.   

## 2018-01-23 NOTE — Discharge Instructions (Addendum)
Please take steroids once daily as directed for the next week.  You can use Zofran as needed for nausea and Bentyl 3 times daily to help with cramping abdominal pain.  I would also like for you to begin using 1 scoop of MiraLAX daily to help keep stool soft.  We will do our GI will reach out to you to schedule follow-up appointment if you do not hear from them in the next few days give them a call.  Return to the emergency department if you have worsening abdominal pain, fevers, persistent vomiting, blood in your stool or still unable to pass stools or any other new or concerning symptoms.

## 2018-01-23 NOTE — ED Provider Notes (Signed)
[facility] EMERGENCY DEPARTMENT Provider Note   CSN: [id number] Arrival date & time: 01/23/18  1132     History   Chief Complaint Chief Complaint  Patient presents with  . Abdominal Pain    HPI Leslie Bolton is a 27 y.o. female.  Leslie Bolton is a 27 y.o. female with history of Crohn's, depression, anxiety, who presents to the emergency department for evaluation of abdominal pain. Pain has been present worsening over the past 3 days.  She reports diffuse abdominal pain, that is cramping in a dull ache.  She reports associated nausea, no episodes of vomiting.  And she has been constipated.  She has been unable to pass anything other than liquid and mucus.  She denies any blood in her stools.  She has not had any fevers or chills.  Patient denies any dysuria or urinary frequency, no vaginal bleeding or discharge.  No chest pain or shortness of breath.  She has not taken anything for pain prior to arrival.  Reports diagnosed with Crohn's and has a known stricture in the sigmoid colon, but no history of bowel obstructions.  She is followed by Dr. Bryan Lemma with [facility].  2 months ago started Entyvio infusions, is not on any other medications to manage her Crohn's.  No history of abdominal surgeries.     Past Medical History:  Diagnosis Date  . Anxiety   . Crohn's disease ([place])   . Depression     Patient Active Problem List   Diagnosis Date Noted  . Blood in stool 11/22/2016  . Episode of heavy vaginal bleeding 11/08/2015  . Weight disorder 02/19/2014  . 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 Contraception management 05/23/2012    Past Surgical History:  Procedure Laterality Date  . WISDOM TOOTH EXTRACTION     removal of all 4. Age 89     OB History   No obstetric history on file.      Home Medications    Prior to Admission medications   Medication Sig Start Date End Date Taking? Authorizing Provider  clindamycin  (CLEOCIN T) 1 % SWAB APPLY TO FACE TWICE DAILY FOR ACNE Patient taking differently: Apply 1 application topically 2 (two) times daily as needed. Apply to face twice daily for acne 11/27/17  Yes [name], MD  vedolizumab (ENTYVIO) 300 MG injection Inject 300 mg into the vein as directed. By dermatologist   Yes [provider]  dicyclomine (BENTYL) 10 MG capsule Take 1 capsule (10 mg total) by mouth 3 (three) times daily as needed for up to 7 days for spasms. 01/23/18 01/30/18  Jacqlyn Larsen, PA-C  ondansetron (ZOFRAN ODT) 4 MG disintegrating tablet 12m ODT q4 hours prn nausea/vomit 01/23/18   FJacqlyn Larsen PA-C  predniSONE (DELTASONE) 20 MG tablet Take 2 tablets (40 mg total) by mouth daily for 7 days. 01/23/18 01/30/18  FJacqlyn Larsen PA-C    Family History Family History  Problem Relation Age of Onset  . Diverticulitis Father        since 2000  . Prostate cancer Maternal Grandfather   . Lung cancer Paternal Grandfather        took vocal cords out  . Colon cancer Neg Hx     Social History Social History   Tobacco Use  . Smoking status: Never Smoker  . Smokeless tobacco: Never Used  . Tobacco comment: tried it once   Substance Use Topics  . Alcohol  use: Yes    Comment: once a week  . Drug use: Not Currently    Comment: have used marijuana for stomach issues     Allergies   Patient has no known allergies.   Review of Systems Review of Systems  Constitutional: Negative for chills and fever.  HENT: Negative.   Eyes: Negative for visual disturbance.  Respiratory: Negative for cough and shortness of breath.   Cardiovascular: Negative for chest pain.  Gastrointestinal: Positive for abdominal pain, constipation and nausea. Negative for vomiting.  Genitourinary: Negative for dysuria and frequency.  Musculoskeletal: Negative for arthralgias and myalgias.  Skin: Negative for color change and rash.  Neurological: Negative for dizziness, syncope, light-headedness  and headaches.     Physical Exam Updated Vital Signs BP 129/89 (BP Location: Right Arm)   Pulse 66   Temp 98 F (36.7 C) (Oral)   Resp 16   Ht 5' 6"  (1.676 m)   Wt 67.1 kg   LMP 01/17/2018   SpO2 100%   BMI 23.89 kg/m   Physical Exam Vitals signs and nursing note reviewed.  Constitutional:      General: She is not in acute distress.    Appearance: She is well-developed and normal weight. She is not ill-appearing or diaphoretic.  HENT:     Head: Normocephalic and atraumatic.     Mouth/Throat:     Mouth: Mucous membranes are moist.     Pharynx: Oropharynx is clear.  Eyes:     General:        Right eye: No discharge.        Left eye: No discharge.  Cardiovascular:     Rate and Rhythm: Normal rate and regular rhythm.     Heart sounds: Normal heart sounds. No murmur. No friction rub. No gallop.   Pulmonary:     Effort: Pulmonary effort is normal. No respiratory distress.     Breath sounds: Normal breath sounds.     Comments: Respirations equal and unlabored, patient able to speak in full sentences, lungs clear to auscultation bilaterally Abdominal:     General: Abdomen is flat. Bowel sounds are normal. There is no distension.     Palpations: Abdomen is soft. There is no mass.     Tenderness: There is generalized abdominal tenderness. There is no right CVA tenderness, left CVA tenderness, guarding or rebound.     Comments: Abdomen is soft, nondistended, bowel sounds present throughout, there is generalized tenderness throughout the abdomen with mild voluntary guarding, no rebound tenderness or rigidity.  No peritoneal signs.  Skin:    General: Skin is warm and dry.     Capillary Refill: Capillary refill takes less than 2 seconds.  Neurological:     Mental Status: She is alert and oriented to person, place, and time.     Coordination: Coordination normal.  Psychiatric:        Mood and Affect: Mood normal.        Behavior: Behavior normal.      ED Treatments / Results   Labs (all labs ordered are listed, but only abnormal results are displayed) Labs Reviewed  COMPREHENSIVE METABOLIC PANEL - Abnormal; Notable for the following components:      Result Value   CO2 21 (*)    All other components within normal limits  CBC WITH DIFFERENTIAL/PLATELET  LIPASE, BLOOD  URINALYSIS, ROUTINE W REFLEX MICROSCOPIC  I-STAT BETA HCG BLOOD, ED ([facility], [facility], [facility] ONLY)    EKG None  Radiology Ct Abdomen  Pelvis W Contrast  Result Date: 01/23/2018 CLINICAL DATA:  Abdominal pain, acute, generalized. Recent diagnosis of Crohn's disease. EXAM: CT ABDOMEN AND PELVIS WITH CONTRAST TECHNIQUE: Multidetector CT imaging of the abdomen and pelvis was performed using the standard protocol following bolus administration of intravenous contrast. CONTRAST:  137m OMNIPAQUE IOHEXOL 300 MG/ML  SOLN COMPARISON:  CT of the abdomen 11/22/2017 FINDINGS: Lower chest: The lung bases are clear without focal nodule, mass, or airspace disease. The heart size is normal. No significant pleural or pericardial effusion is present. Hepatobiliary: No focal liver abnormality is seen. No gallstones, gallbladder wall thickening, or biliary dilatation. Pancreas: Unremarkable. No pancreatic ductal dilatation or surrounding inflammatory changes. Spleen: Normal in size without focal abnormality. Adrenals/Urinary Tract: Adrenal glands are normal bilaterally. Kidneys are unremarkable. There is no stone or mass lesion. Ureters are within normal limits. The urinary bladder is normal. Stomach/Bowel: Stomach and duodenum are within normal limits. Small bowel is unremarkable. Terminal ileum is within normal limits. The appendix is visualized and normal. Ascending transverse colon are normal. Descending and sigmoid colon are within normal limits. The descending colon is within normal limits. Asymmetric wall thickening previously noted in the sigmoid colon has improved. There is mild stranding with small lymph nodes in the small bowel  mesentery. Vascular/Lymphatic: No significant vascular findings are present. No enlarged abdominal or pelvic lymph nodes. Reproductive: IUD is in place.  Adnexa are unremarkable. Other: No abdominal wall hernia or abnormality. No abdominopelvic ascites. Musculoskeletal: Vertebral body heights and alignment are maintained. Pelvis is within normal limits. Hips are located and unremarkable. IMPRESSION: 1. Wall thickening inflammatory changes of the sigmoid colon is improved. No new segments are involved. 2. Mild stranding and small lymph nodes within the small bowel mesentery may be reactive. No focal bowel lesions are evident. 3. Otherwise normal CT of the abdomen pelvis. No other acute or focal lesion to explain the patient's abdominal pain. Electronically Signed   By: CSan MorelleM.D.   On: 01/23/2018 13:59    Procedures Procedures (including critical care time)  Medications Ordered in ED Medications  sodium chloride 0.9 % bolus 1,000 mL (0 mLs Intravenous Stopped 01/23/18 1307)  ondansetron (ZOFRAN) injection 4 mg (4 mg Intravenous Given 01/23/18 1219)  morphine 4 MG/ML injection 4 mg (4 mg Intravenous Given 01/23/18 1219)  dicyclomine (BENTYL) injection 20 mg (20 mg Intramuscular Given 01/23/18 1219)  iohexol (OMNIPAQUE) 300 MG/ML solution 100 mL (100 mLs Intravenous Contrast Given 01/23/18 1336)  predniSONE (DELTASONE) tablet 40 mg (40 mg Oral Given 01/23/18 1627)     Initial Impression / Assessment and Plan / ED Course  I have reviewed the triage vital signs and the nursing notes.  Pertinent labs & imaging results that were available during my care of the patient were reviewed by me and considered in my medical decision making (see chart for details).  Patient presents to the emergency department for evaluation of 3 days of worsening generalized abdominal pain with associated nausea and constipation.  Patient diagnosed with Crohn's, but has no history of prior complications.  Is on  Entyvio infusions but no other medications to manage her Crohn's.  Associated severe nausea and decreased appetite, reports she is only been able to pass liquid and mucus when she tries to have a bowel movement over the past 3 days.  Is followed by Dr[facility] CBryan Lemmawith [facility].  On arrival normal vitals.  She has generalized abdominal tenderness with mild voluntary guarding.  Patient has a known sigmoid stenosis,  this could have progressed to obstruction, patient would also have diverticulitis or other acute intra-abdominal pathology also could just have been uncomplicated Crohn's flare.  Will get abdominal labs and CT abdomen pelvis will give IV fluids, Zofran, Bentyl and morphine for symptomatic management.  Labs overall reassuring, no leukocytosis, no acute electrolyte derangements, normal renal liver function, normal lipase, negative pregnancy, urinalysis without any signs of infection.  CT shows improvement in sigmoid colon wall thickening when compared to prior, there is some mild stranding noted throughout the small bowel, but no focal bowel lesions and no other acute findings.  Patient has no acute surgical findings on CT, will discuss with both our GI.  Case discussed with Lorenza Chick  PA with GI, to see the patient.  PA Alonza Bogus with GI has seen and evaluated the patient, she can likely go home given significant improvement with her symptoms with treatment here in the emergency department.  Recommends discharging with 40 mg of prednisone daily for a week and Zofran and bentyl for symptomatic management.  Plan discussed with patient, who expresses understanding and agreement with plan.  GI office will reach out to patient to schedule follow-up within the next week.  Patient discharged home in good condition.  Final Clinical Impressions(s) / ED Diagnoses   Final diagnoses:  Generalized abdominal pain  Constipation, unspecified constipation type  Crohn's disease of colon with  complication [facility])    ED Discharge Orders         Ordered    dicyclomine (BENTYL) 10 MG capsule  3 times daily PRN     01/23/18 1550    ondansetron (ZOFRAN ODT) 4 MG disintegrating tablet     01/23/18 1550    predniSONE (DELTASONE) 20 MG tablet  Daily     01/23/18 1550           Jacqlyn Larsen, [place] 01/25/18 1905    Drenda Freeze, MD 01/27/18 1455

## 2018-01-23 NOTE — Consult Note (Signed)
Referring Provider:  Benedetto Goad, PA-C in the ED Primary Care Physician:  [name], MD Primary Gastroenterologist:  Dr. Bryan Lemma  Reason for Consultation:  Abdominal pain, Crohn's disease  HPI: Leslie Bolton is a 27 y.o. female with newly diagnosed stricturing type colonic Crohn's disease with a high-grade stenosis in the distal sigmoid colon which was non-traversable.  Colonoscopy 10/2017 as follows:  - Stricture in the recto-sigmoid colon, located 20 cm from the anal verge. This could not be traversed despite changing to a smaller-caliber endoscope. Biopsied. Tattooed. - The rectum is normal. - The distal rectum and anal verge are normal on retroflexion view.  Biopsies notable for chronic inactive colitis.   Presented to the ED today with increasing abdominal pain over the past 3 days.  Not passing much stool per rectum, just mucus.  Has daily nausea but that has been ongoing for quite some time now.  Recently started [facility] and has only had 2 induction doses so far.  Labs all normal.  CT scan abdomen and pelvis as follows:  IMPRESSION: 1. Wall thickening inflammatory changes of the sigmoid colon is improved. No new segments are involved. 2. Mild stranding and small lymph nodes within the small bowel mesentery may be reactive. No focal bowel lesions are evident. 3. Otherwise normal CT of the abdomen pelvis. No other acute or focal lesion to explain the patient's abdominal pain.     Past Medical History:  Diagnosis Date  . Anxiety   . Crohn's disease ([place])   . Depression     Past Surgical History:  Procedure Laterality Date  . WISDOM TOOTH EXTRACTION     removal of all 4. Age 56    Prior to Admission medications   Medication Sig Start Date End Date Taking? Authorizing Provider  clindamycin (CLEOCIN T) 1 % SWAB APPLY TO FACE TWICE DAILY FOR ACNE Patient taking differently: Apply 1 application topically 2 (two) times daily as needed. Apply to face twice daily  for acne 11/27/17  Yes [name], MD  vedolizumab (ENTYVIO) 300 MG injection Inject 300 mg into the vein as directed. By dermatologist   Yes [provider]    No current facility-administered medications for this encounter.    Current Outpatient Medications  Medication Sig Dispense Refill  . clindamycin (CLEOCIN T) 1 % SWAB APPLY TO FACE TWICE DAILY FOR ACNE (Patient taking differently: Apply 1 application topically 2 (two) times daily as needed. Apply to face twice daily for acne) 60 Package 2  . vedolizumab (ENTYVIO) 300 MG injection Inject 300 mg into the vein as directed. By dermatologist      Allergies as of 01/23/2018  . (No Known Allergies)    Family History  Problem Relation Age of Onset  . Diverticulitis Father        since 2000  . Prostate cancer Maternal Grandfather   . Lung cancer Paternal Grandfather        took vocal cords out  . Colon cancer Neg Hx     Social History   Socioeconomic History  . Marital status: Married    Spouse name: Not on file  . Number of children: 0  . Years of education: Not on file  . Highest education level: Not on file  Occupational History  . Not on file  Social Needs  . Financial resource strain: Not on file  . Food insecurity:    Worry: Not on file    Inability: Not on file  .  Transportation needs:    Medical: Not on file    Non-medical: Not on file  Tobacco Use  . Smoking status: Never Smoker  . Smokeless tobacco: Never Used  . Tobacco comment: tried it once   Substance and Sexual Activity  . Alcohol use: Yes    Comment: once a week  . Drug use: Not Currently    Comment: have used marijuana for stomach issues  . Sexual activity: Not on file  Lifestyle  . Physical activity:    Days per week: Not on file    Minutes per session: Not on file  . Stress: Not on file  Relationships  . Social connections:    Talks on phone: Not on file    Gets together: Not on file    Attends religious service: Not on file      Active member of club or organization: Not on file    Attends meetings of clubs or organizations: Not on file    Relationship status: Not on file  . Intimate partner violence:    Fear of current or ex partner: Not on file    Emotionally abused: Not on file    Physically abused: Not on file    Forced sexual activity: Not on file  Other Topics Concern  . Not on file  Social History Narrative  . Not on file    Review of Systems: ROS is O/W negative except as mentioned in HPI.  Physical Exam: Vital signs in last 24 hours: Temp:  [98 F (36.7 C)] 98 F (36.7 C) (12/24 1153) Pulse Rate:  [66-80] 80 (12/24 1400) Resp:  [16-18] 18 (12/24 1400) BP: (121-129)/(83-89) 126/87 (12/24 1400) SpO2:  [100 %] 100 % (12/24 1400) Weight:  [67.1 kg] 67.1 kg (12/24 1151)   General:  Alert, Well-developed, well-nourished, pleasant and cooperative in NAD Head:  Normocephalic and atraumatic. Eyes:  Sclera clear, no icterus.  Conjunctiva pink. Ears:  Normal auditory acuity. Mouth:  No deformity or lesions.   Lungs:  Clear throughout to auscultation.  No wheezes, crackles, or rhonchi.  Heart:  Regular rate and rhythm; no murmurs, clicks, rubs, or gallops. Abdomen:  Soft, non-distended.  BS present but quiet.  Diffuse TTP but greatest in LLQ.  Msk:  Symmetrical without gross deformities. Pulses:  Normal pulses noted. Extremities:  Without clubbing or edema. Neurologic:  Alert and oriented x 4;  grossly normal neurologically. Skin:  Intact without significant lesions or rashes. Psych:  Alert and cooperative. Normal mood and affect.  Intake/Output this shift: Total I/O In: 1000 [IV Piggyback:1000] Out: -   Lab Results: Recent Labs    01/23/18 1225  WBC 8.9  HGB 12.1  HCT 37.2  PLT 315   BMET Recent Labs    01/23/18 1225  NA 139  K 3.5  CL 108  CO2 21*  GLUCOSE 89  BUN 15  CREATININE 0.83  CALCIUM 9.5   LFT Recent Labs    01/23/18 1225  PROT 7.1  ALBUMIN 4.5  AST 15   ALT 11  ALKPHOS 49  BILITOT 0.9   Studies/Results: Ct Abdomen Pelvis W Contrast  Result Date: 01/23/2018 CLINICAL DATA:  Abdominal pain, acute, generalized. Recent diagnosis of Crohn's disease. EXAM: CT ABDOMEN AND PELVIS WITH CONTRAST TECHNIQUE: Multidetector CT imaging of the abdomen and pelvis was performed using the standard protocol following bolus administration of intravenous contrast. CONTRAST:  146m OMNIPAQUE IOHEXOL 300 MG/ML  SOLN COMPARISON:  CT of the abdomen 11/22/2017 FINDINGS: Lower chest:  The lung bases are clear without focal nodule, mass, or airspace disease. The heart size is normal. No significant pleural or pericardial effusion is present. Hepatobiliary: No focal liver abnormality is seen. No gallstones, gallbladder wall thickening, or biliary dilatation. Pancreas: Unremarkable. No pancreatic ductal dilatation or surrounding inflammatory changes. Spleen: Normal in size without focal abnormality. Adrenals/Urinary Tract: Adrenal glands are normal bilaterally. Kidneys are unremarkable. There is no stone or mass lesion. Ureters are within normal limits. The urinary bladder is normal. Stomach/Bowel: Stomach and duodenum are within normal limits. Small bowel is unremarkable. Terminal ileum is within normal limits. The appendix is visualized and normal. Ascending transverse colon are normal. Descending and sigmoid colon are within normal limits. The descending colon is within normal limits. Asymmetric wall thickening previously noted in the sigmoid colon has improved. There is mild stranding with small lymph nodes in the small bowel mesentery. Vascular/Lymphatic: No significant vascular findings are present. No enlarged abdominal or pelvic lymph nodes. Reproductive: IUD is in place.  Adnexa are unremarkable. Other: No abdominal wall hernia or abnormality. No abdominopelvic ascites. Musculoskeletal: Vertebral body heights and alignment are maintained. Pelvis is within normal limits. Hips are  located and unremarkable. IMPRESSION: 1. Wall thickening inflammatory changes of the sigmoid colon is improved. No new segments are involved. 2. Mild stranding and small lymph nodes within the small bowel mesentery may be reactive. No focal bowel lesions are evident. 3. Otherwise normal CT of the abdomen pelvis. No other acute or focal lesion to explain the patient's abdominal pain. Electronically Signed   By: San Morelle M.D.   On: 01/23/2018 13:59   IMPRESSION:  *27 year old female with newly diagnosed colonic Crohn's disease stricturing type colonic Crohn's disease with a high-grade stenosis in the distal sigmoid colon which was non-traversable.  On Entyvio, just started recently.  CT scan shows improvement compared to October.  PLAN: -Labs are normal, CT scan actually looks improved.  She would like to go home instead of being admitted.  Discussed with Dr. Tarri Glenn.  Will try to let her go home on some PO steroids, prednisone 40 mg daily, as well as zofran for nausea, and Bentyl for spasm.  I also recommended that she be taking Miralax daily to keep stools soft so that they can pass through the area of narrowing in the sigmoid colon.  Will let her primary GI, Dr. Bryan Lemma, know about her visit here so that he can follow-up with her.  Laban Emperor. Taelor Moncada  01/23/2018, 2:32 PM

## 2018-01-23 NOTE — ED Triage Notes (Signed)
Pt states she was recently diagnosed with Crohn's disease and has a known stricture in the sigmoid colon. Over the past 3 days pt has been having worsening abd cramping, n/v and constipation.

## 2018-01-26 ENCOUNTER — Telehealth: Payer: Self-pay | Admitting: Gastroenterology

## 2018-01-26 NOTE — Telephone Encounter (Signed)
Called and spoke to patient - patient reports no relief from constipation with the use of Miralax, increased po intake, and eating of small frequent meals; patient is going to increase the Miralax to twice a day, continue increased po intake, low residue diet with eating small frequent meals; patient reports she is taking the Prednisone as prescribed as well as the Bentyl and Zofran which are helping some; Patient verbalized understanding of information/instructions; Patient was advised to call back if questions/concerns arise;

## 2018-01-26 NOTE — Telephone Encounter (Signed)
Pt has appt 1/7.  Pt states that she has worsening abd cramping and constipation.  No BM in 4 days.  Pt would like a call to discuss symptoms.

## 2018-01-30 ENCOUNTER — Telehealth: Payer: Self-pay | Admitting: Gastroenterology

## 2018-01-30 NOTE — Telephone Encounter (Signed)
Patient has not been contacted at this time; Please review patient message and please advise on plan of action for this patient-

## 2018-01-31 NOTE — Telephone Encounter (Signed)
Spoke with Leslie Bolton at length today and reviewed her recent ER evaluation.  Unfortunately, she has had no response to trial of prednisone 40 mg daily, MiraLAX twice daily.  No BM in 10+ days.  Reviewed her prior CT and although the inflammation appears to be improved there is still appears to be narrowing of the sigmoid colon.  Interestingly there was stool distal to this point but clinical presentation seems consistent with obstruction.  Bentyl and Zofran helping for symptoms.  Given the lack of response to therapy, find it prudent to return to the ER for expedited evaluation.  As I am on the inpatient service this week I will be the 1 to evaluate her with possible admission.  We will plan on imaging in the ER along with repeat labs to include ESR/CRP.  Discussed multiple treatment options to include bowel prep and enemas, repeat endoscopy for both diagnostic and potentially therapeutic intent, and surgical evaluation.  She understands these recommendations and will plan on heading to the ER in the morning for expedited evaluation and treatment. 

## 2018-02-01 ENCOUNTER — Observation Stay (HOSPITAL_COMMUNITY)
Admission: EM | Admit: 2018-02-01 | Discharge: 2018-02-02 | Payer: 59 | Attending: Internal Medicine | Admitting: Internal Medicine

## 2018-02-01 ENCOUNTER — Emergency Department (HOSPITAL_COMMUNITY): Payer: 59

## 2018-02-01 ENCOUNTER — Other Ambulatory Visit: Payer: Self-pay

## 2018-02-01 ENCOUNTER — Encounter (HOSPITAL_COMMUNITY): Payer: Self-pay | Admitting: Emergency Medicine

## 2018-02-01 DIAGNOSIS — K56609 Unspecified intestinal obstruction, unspecified as to partial versus complete obstruction: Secondary | ICD-10-CM | POA: Diagnosis present

## 2018-02-01 DIAGNOSIS — K50112 Crohn's disease of large intestine with intestinal obstruction: Principal | ICD-10-CM | POA: Insufficient documentation

## 2018-02-01 DIAGNOSIS — Z79899 Other long term (current) drug therapy: Secondary | ICD-10-CM | POA: Diagnosis not present

## 2018-02-01 DIAGNOSIS — G4700 Insomnia, unspecified: Secondary | ICD-10-CM

## 2018-02-01 DIAGNOSIS — K50119 Crohn's disease of large intestine with unspecified complications: Secondary | ICD-10-CM | POA: Diagnosis not present

## 2018-02-01 DIAGNOSIS — Z793 Long term (current) use of hormonal contraceptives: Secondary | ICD-10-CM | POA: Insufficient documentation

## 2018-02-01 DIAGNOSIS — R109 Unspecified abdominal pain: Secondary | ICD-10-CM | POA: Insufficient documentation

## 2018-02-01 DIAGNOSIS — Z791 Long term (current) use of non-steroidal anti-inflammatories (NSAID): Secondary | ICD-10-CM | POA: Insufficient documentation

## 2018-02-01 DIAGNOSIS — K56699 Other intestinal obstruction unspecified as to partial versus complete obstruction: Secondary | ICD-10-CM

## 2018-02-01 LAB — URINALYSIS, ROUTINE W REFLEX MICROSCOPIC
Bilirubin Urine: NEGATIVE
Glucose, UA: NEGATIVE mg/dL
Ketones, ur: 5 mg/dL — AB
Nitrite: NEGATIVE
Protein, ur: NEGATIVE mg/dL
Specific Gravity, Urine: 1.029 (ref 1.005–1.030)
pH: 5 (ref 5.0–8.0)

## 2018-02-01 LAB — CBC WITH DIFFERENTIAL/PLATELET
ABS IMMATURE GRANULOCYTES: 0.15 10*3/uL — AB (ref 0.00–0.07)
Basophils Absolute: 0.1 10*3/uL (ref 0.0–0.1)
Basophils Relative: 0 %
Eosinophils Absolute: 0.2 10*3/uL (ref 0.0–0.5)
Eosinophils Relative: 1 %
HCT: 44.4 % (ref 36.0–46.0)
Hemoglobin: 14.6 g/dL (ref 12.0–15.0)
Immature Granulocytes: 1 %
LYMPHS ABS: 4.4 10*3/uL — AB (ref 0.7–4.0)
Lymphocytes Relative: 25 %
MCH: 29.2 pg (ref 26.0–34.0)
MCHC: 32.9 g/dL (ref 30.0–36.0)
MCV: 88.8 fL (ref 80.0–100.0)
Monocytes Absolute: 1.4 10*3/uL — ABNORMAL HIGH (ref 0.1–1.0)
Monocytes Relative: 8 %
Neutro Abs: 11.6 10*3/uL — ABNORMAL HIGH (ref 1.7–7.7)
Neutrophils Relative %: 65 %
Platelets: 397 10*3/uL (ref 150–400)
RBC: 5 MIL/uL (ref 3.87–5.11)
RDW: 12.4 % (ref 11.5–15.5)
WBC: 17.7 10*3/uL — ABNORMAL HIGH (ref 4.0–10.5)
nRBC: 0 % (ref 0.0–0.2)

## 2018-02-01 LAB — COMPREHENSIVE METABOLIC PANEL
ALT: 12 U/L (ref 0–44)
AST: 13 U/L — ABNORMAL LOW (ref 15–41)
Albumin: 4.7 g/dL (ref 3.5–5.0)
Alkaline Phosphatase: 52 U/L (ref 38–126)
Anion gap: 10 (ref 5–15)
BUN: 16 mg/dL (ref 6–20)
CO2: 25 mmol/L (ref 22–32)
Calcium: 9.5 mg/dL (ref 8.9–10.3)
Chloride: 104 mmol/L (ref 98–111)
Creatinine, Ser: 0.85 mg/dL (ref 0.44–1.00)
GFR calc Af Amer: >60 mL/min (ref >60)
GFR calc non Af Amer: >60 mL/min (ref >60)
Glucose, Bld: 86 mg/dL (ref 70–99)
Potassium: 3.5 mmol/L (ref 3.5–5.1)
SODIUM: 139 mmol/L (ref 135–145)
Total Bilirubin: 1.2 mg/dL (ref 0.3–1.2)
Total Protein: 7.8 g/dL (ref 6.5–8.1)

## 2018-02-01 LAB — SEDIMENTATION RATE: Sed Rate: 0 mm/hr (ref 0–22)

## 2018-02-01 LAB — C-REACTIVE PROTEIN: CRP: <0.08 mg/dL (ref <1.0)

## 2018-02-01 LAB — I-STAT BETA HCG BLOOD, ED (MC, WL, AP ONLY): I-stat hCG, quantitative: <5 m[IU]/mL (ref <5)

## 2018-02-01 LAB — LIPASE, BLOOD: Lipase: 26 U/L (ref 11–51)

## 2018-02-01 MED ORDER — FENTANYL CITRATE (PF) 100 MCG/2ML IJ SOLN
50.0000 ug | Freq: Once | INTRAMUSCULAR | Status: AC
  Administered 2018-02-01: 50 ug via INTRAVENOUS
  Filled 2018-02-01: qty 2

## 2018-02-01 MED ORDER — SODIUM CHLORIDE (PF) 0.9 % IJ SOLN
INTRAMUSCULAR | Status: AC
  Filled 2018-02-01: qty 50

## 2018-02-01 MED ORDER — METHYLPREDNISOLONE SODIUM SUCC 40 MG IJ SOLR: 20.0000 mg | Freq: Three times a day (TID) | INTRAMUSCULAR | Status: DC

## 2018-02-01 MED ORDER — PEG-KCL-NACL-NASULF-NA ASC-C 100 G PO SOLR
1.0000 | Freq: Once | ORAL | Status: DC
  Filled 2018-02-01: qty 1

## 2018-02-01 MED ORDER — SODIUM CHLORIDE 0.9 % IV SOLN
INTRAVENOUS | Status: DC
  Administered 2018-02-01 – 2018-02-02 (×2): via INTRAVENOUS

## 2018-02-01 MED ORDER — DICYCLOMINE HCL 10 MG PO CAPS
10.0000 mg | ORAL_CAPSULE | Freq: Three times a day (TID) | ORAL | Status: DC | PRN
  Filled 2018-02-01: qty 1

## 2018-02-01 MED ORDER — SODIUM CHLORIDE 0.9 % IV BOLUS
500.0000 mL | Freq: Once | INTRAVENOUS | Status: AC
  Administered 2018-02-01: 500 mL via INTRAVENOUS

## 2018-02-01 MED ORDER — FLEET ENEMA 7-19 GM/118ML RE ENEM
1.0000 | ENEMA | Freq: Once | RECTAL | Status: AC
  Administered 2018-02-01: 1 via RECTAL
  Filled 2018-02-01: qty 1

## 2018-02-01 MED ORDER — ONDANSETRON HCL 4 MG/2ML IJ SOLN
4.0000 mg | Freq: Once | INTRAMUSCULAR | Status: AC
  Administered 2018-02-01: 4 mg via INTRAVENOUS
  Filled 2018-02-01: qty 2

## 2018-02-01 MED ORDER — ONDANSETRON HCL 4 MG/2ML IJ SOLN
4.0000 mg | Freq: Four times a day (QID) | INTRAMUSCULAR | Status: DC | PRN
  Administered 2018-02-01 – 2018-02-02 (×3): 4 mg via INTRAVENOUS
  Filled 2018-02-01 (×3): qty 2

## 2018-02-01 MED ORDER — IOPAMIDOL (ISOVUE-300) INJECTION 61%
100.0000 mL | Freq: Once | INTRAVENOUS | Status: AC | PRN
  Administered 2018-02-01: 100 mL via INTRAVENOUS

## 2018-02-01 MED ORDER — MORPHINE SULFATE (PF) 4 MG/ML IV SOLN
4.0000 mg | INTRAVENOUS | Status: DC | PRN
  Administered 2018-02-01 – 2018-02-02 (×4): 4 mg via INTRAVENOUS
  Filled 2018-02-01 (×4): qty 1

## 2018-02-01 MED ORDER — OXYCODONE HCL 5 MG PO TABS
5.0000 mg | ORAL_TABLET | ORAL | Status: DC | PRN
  Administered 2018-02-02: 5 mg via ORAL
  Filled 2018-02-01: qty 1

## 2018-02-01 MED ORDER — IOHEXOL 300 MG/ML  SOLN
30.0000 mL | Freq: Once | INTRAMUSCULAR | Status: AC | PRN
  Administered 2018-02-01: 30 mL via ORAL

## 2018-02-01 MED ORDER — ENOXAPARIN SODIUM 40 MG/0.4ML ~~LOC~~ SOLN
40.0000 mg | SUBCUTANEOUS | Status: DC
  Administered 2018-02-01: 40 mg via SUBCUTANEOUS
  Filled 2018-02-01 (×2): qty 0.4

## 2018-02-01 MED ORDER — IOPAMIDOL (ISOVUE-300) INJECTION 61%
INTRAVENOUS | Status: AC
  Filled 2018-02-01: qty 100

## 2018-02-01 MED ORDER — METHYLPREDNISOLONE SODIUM SUCC 40 MG IJ SOLR
20.0000 mg | Freq: Three times a day (TID) | INTRAMUSCULAR | Status: DC
  Administered 2018-02-01 – 2018-02-02 (×4): 20 mg via INTRAVENOUS
  Filled 2018-02-01 (×4): qty 1

## 2018-02-01 MED ORDER — ONDANSETRON HCL 4 MG PO TABS: 4.0000 mg | ORAL_TABLET | Freq: Four times a day (QID) | ORAL | Status: DC | PRN

## 2018-02-01 NOTE — Consult Note (Signed)
Consultation  Referring Provider: Dr. Alvino Chapel     Primary Care Physician:  [name], MD Primary Gastroenterologist: Dr. Bryan Lemma        Reason for Consultation:   Constipation, Abdominal pain, Crohn's disease         HPI:   Leslie Bolton is a 28 y.o. female with a past medical history as listed below including Crohn's disease, who is well-known by our service and presents to the ER today with a complaint of constipation and abdominal pain.    01/30/2018 telephone encounter with Dr. Bryan Lemma.  At that time, her recent ER evaluation on 24 December was discussed.  She had had no response to trial of Prednisone 40 mg daily, MiraLAX twice daily.  She described no bowel movement in 10+ days.  Her CT was reviewed which showed inflammation appeared to be improved but was still showing narrowing of the sigmoid colon, Bentyl and Zofran were helpful for symptoms.  Discussed that given her lack of response to therapy was recommended she proceed to the ER.    Today, patient explains that she continues with a generalized abdominal pain worse while she is trying to drink her contrast for CT.  Describes this as "little firecrackers" going off on both sides of her abdomen.  Explains that she had not much of an appetite for 5 to 6 days after ER evaluation last, but just yesterday did get an appetite and chose to have a pretty normal eating day, though now "I regret it".  Patient continues to describe that she has not passed a bowel movement in at least 10+ days.  Has been on her medications diligently.    Denies fever, chills or vomiting.  GI history: 01/23/2018 CT abdomen pelvis: Wall thickening inflammatory changes the sigmoid colon had improved, no new segments involved.  Mild stranding and small lymph nodes within the small bowel mesentery may be reactive.  No focal bowel lesions were evident.  Otherwise normal CT of the abdomen pelvis. 01/23/2018 consult by our service: Patient declined  admission for abdominal pain related to newly diagnosed stricturing, she discussed being on Entyvio, status post 2 infusions so far, her next due 02/07/2018, Prednisone 40 mg was added. 11/22/2017 CT abdomen pelvis: Long segment of marked mucosal thickening of the distal sigmoid colon and rectum, with mild pericolonic inflammatory changes, likely related to Crohn's disease.  3.2 cm left ovarian cyst most certainly benign. 11/08/2017 colonoscopy: Stricture in the rectosigmoid colon, located 20 cm from the anal verge, was could not be traversed despite changing to a small caliber endoscope, rectum was normal, distal rectum and anal verge were normal.  Pathology showed inactive chronic nonspecific colitis.   Past Medical History:  Diagnosis Date  . Anxiety   . Crohn's disease ([place])   . Depression     Past Surgical History:  Procedure Laterality Date  . WISDOM TOOTH EXTRACTION     removal of all 4. Age 25    Family History  Problem Relation Age of Onset  . Diverticulitis Father        since 2000  . Prostate cancer Maternal Grandfather   . Lung cancer Paternal Grandfather        took vocal cords out  . Colon cancer Neg Hx     Social History   Tobacco Use  . Smoking status: Never Smoker  . Smokeless tobacco: Never Used  . Tobacco comment: tried it once   Substance Use Topics  . Alcohol  use: Yes    Comment: once a week  . Drug use: Not Currently    Comment: have used marijuana for stomach issues    Prior to Admission medications   Medication Sig Start Date End Date Taking? Authorizing Provider  dicyclomine (BENTYL) 10 MG capsule Take 1 capsule (10 mg total) by mouth 3 (three) times daily as needed for up to 7 days for spasms. 01/23/18 02/01/18 Yes Jacqlyn Larsen, PA-C  ibuprofen (ADVIL,MOTRIN) 200 MG tablet Take 400 mg by mouth every 6 (six) hours as needed for headache or mild pain.   Yes [provider]  levonorgestrel (MIRENA) 20 MCG/24HR IUD 1 each by Intrauterine route  once.   Yes [provider]  Multiple Vitamin (MULTIVITAMIN WITH MINERALS) TABS tablet Take 1 tablet by mouth daily.   Yes [provider]  ondansetron (ZOFRAN ODT) 4 MG disintegrating tablet 54m ODT q4 hours prn nausea/vomit 01/23/18  Yes Ford, Kelsey N, PA-C  predniSONE (DELTASONE) 10 MG tablet Take 10 mg by mouth 2 (two) times daily with a meal. 7 day course   Yes [provider]  vedolizumab (ENTYVIO) 300 MG injection Inject 300 mg into the vein as directed. By dermatologist   Yes [provider]  clindamycin (CLEOCIN T) 1 % SWAB APPLY TO FACE TWICE DAILY FOR ACNE Patient not taking: Apply to face twice daily for acne 11/27/17   NDickie La MD    Current Facility-Administered Medications  Medication Dose Route Frequency Provider Last Rate Last Dose  . sodium chloride 0.9 % bolus 500 mL  500 mL Intravenous Once PDavonna Belling MD 491.8 mL/hr at 02/01/18 1134 500 mL at 02/01/18 1134   Current Outpatient Medications  Medication Sig Dispense Refill  . dicyclomine (BENTYL) 10 MG capsule Take 1 capsule (10 mg total) by mouth 3 (three) times daily as needed for up to 7 days for spasms. 30 capsule 0  . ibuprofen (ADVIL,MOTRIN) 200 MG tablet Take 400 mg by mouth every 6 (six) hours as needed for headache or mild pain.    .Marland Kitchenlevonorgestrel (MIRENA) 20 MCG/24HR IUD 1 each by Intrauterine route once.    . Multiple Vitamin (MULTIVITAMIN WITH MINERALS) TABS tablet Take 1 tablet by mouth daily.    . ondansetron (ZOFRAN ODT) 4 MG disintegrating tablet 418mODT q4 hours prn nausea/vomit 10 tablet 0  . predniSONE (DELTASONE) 10 MG tablet Take 10 mg by mouth 2 (two) times daily with a meal. 7 day course    . vedolizumab (ENTYVIO) 300 MG injection Inject 300 mg into the vein as directed. By dermatologist    . clindamycin (CLEOCIN T) 1 % SWAB APPLY TO FACE TWICE DAILY FOR ACNE (Patient not taking: Apply to face twice daily for acne) 60 Package 2    Allergies as of  02/01/2018  . (No Known Allergies)   Review of Systems:    Constitutional: No fever or chills Skin: No rash Cardiovascular: No chest pain Respiratory: No SOB  Gastrointestinal: See HPI and otherwise negative Genitourinary: No dysuria  Neurological: No headache, dizziness or syncope Musculoskeletal: No new muscle or joint pain Hematologic: No bleeding  Psychiatric: +anxiety and depression   Physical Exam:  Vital signs in last 24 hours: Temp:  [98.3 F (36.8 C)] 98.3 F (36.8 C) (01/02 1013) Pulse Rate:  [51-73] 51 (01/02 1216) Resp:  [15-16] 15 (01/02 1216) BP: (122-131)/(90-96) 122/90 (01/02 1216) SpO2:  [97 %-100 %] 100 % (01/02 1216)   General:   Pleasant Caucasian  female appears to be uncomfortable, Well developed, Well nourished, alert and cooperative Head:  Normocephalic and atraumatic. Eyes:   PEERL, EOMI. No icterus. Conjunctiva pink. Ears:  Normal auditory acuity. Neck:  Supple Throat: Oral cavity and pharynx without inflammation, swelling or lesion. Teeth in good condition. Lungs: Respirations even and unlabored. Lungs clear to auscultation bilaterally.   No wheezes, crackles, or rhonchi.  Heart: Normal S1, S2. No MRG. Regular rate and rhythm. No peripheral edema, cyanosis or pallor.  Abdomen:  Soft, nondistended, Marked Generalized ttp with involuntary guarding, decreased BS, No appreciable masses or hepatomegaly. Rectal:  Not performed.  Msk:  Symmetrical without gross deformities. Peripheral pulses intact.  Extremities:  Without edema, no deformity or joint abnormality.  Neurologic:  Alert and  oriented x4;  grossly normal neurologically.  Skin:   Dry and intact without significant lesions or rashes. Psychiatric: Demonstrates good judgement and reason without abnormal affect or behaviors.   LAB RESULTS: Recent Labs    02/01/18 1133  WBC 17.7*  HGB 14.6  HCT 44.4  PLT 397   BMET Recent Labs    02/01/18 1133  NA 139  K 3.5  CL 104  CO2 25  GLUCOSE  86  BUN 16  CREATININE 0.85  CALCIUM 9.5   LFT Recent Labs    02/01/18 1133  PROT 7.8  ALBUMIN 4.7  AST 13*  ALT 12  ALKPHOS 52  BILITOT 1.2    Impression / Plan:   Impression: 1.  Colonic Crohn's disease with stricturing: Patient with recent colonoscopy in October revealing stricture, previous diagnosis of Crohn's disease, status post 2 doses of Entyvio, stricture persists; most likely related to Crohn's disease versus less likely diverticular stricturing  Plan: 1.  Patient was seen with Dr. Bryan Lemma in the ER.  Next steps are to await CT abdomen pelvis which has been ordered. 2.  Patient should be admitted to the hospitalist service. 3.  Will discuss starting IV steroids 4.  We will need to check CRP and ESR which have been ordered 5.  Can also discuss possible barium enema to further survey stricture i.e. length etc. 6.  Case has been reviewed with Dr. Marcello Moores from [place] as well as Dr. Dema Severin and they are willing to be consulted if needed 7.  Please await any final recommendations from Dr. Bryan Lemma later today.  Thank you for your kind consultation, we will continue to follow.  Lavone Nian [facility]  02/01/2018, 12:27 PM

## 2018-02-01 NOTE — ED Provider Notes (Signed)
[facility] DEPT Provider Note   CSN: [id number] Arrival date & time: 02/01/18  1004     History   Chief Complaint Chief Complaint  Patient presents with  . Constipation    HPI Leslie Bolton is a 28 y.o. female.  HPI Patient with abdominal pain.  Has had over the last couple weeks.  Seen in the ER and had CT scan that showed sigmoid stricture.  Has been seen by the [facility] gastroenterology.  However states over the last 11 days she has not had a bowel movement.  Still rarely passing some gas.  Occasional pass little mucus without blood.  Pain is now more diffuse over her abdomen.  Decreased appetite.  No fevers.  Patient had called gastroenterology and they want her to come in the ER for further imaging.  Her gastroenterologist is rounding today.  Pain is dull and worse with eating. Past Medical History:  Diagnosis Date  . Anxiety   . Crohn's disease ([place])   . Depression     Patient Active Problem List   Diagnosis Date Noted  . Abdominal pain   . Crohn's disease of large intestine with complication ([place])   . Blood in stool 11/22/2016  . Episode of heavy vaginal bleeding 11/08/2015  . Weight disorder 02/19/2014  . 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 Contraception management 05/23/2012    Past Surgical History:  Procedure Laterality Date  . WISDOM TOOTH EXTRACTION     removal of all 4. Age 53     OB History   No obstetric history on file.      Home Medications    Prior to Admission medications   Medication Sig Start Date End Date Taking? Authorizing Provider  dicyclomine (BENTYL) 10 MG capsule Take 1 capsule (10 mg total) by mouth 3 (three) times daily as needed for up to 7 days for spasms. 01/23/18 02/01/18 Yes Jacqlyn Larsen, PA-C  ibuprofen (ADVIL,MOTRIN) 200 MG tablet Take 400 mg by mouth every 6 (six) hours as needed for headache or mild pain.   Yes [provider]  levonorgestrel (MIRENA) 20  MCG/24HR IUD 1 each by Intrauterine route once.   Yes [provider]  Multiple Vitamin (MULTIVITAMIN WITH MINERALS) TABS tablet Take 1 tablet by mouth daily.   Yes [provider]  ondansetron (ZOFRAN ODT) 4 MG disintegrating tablet 41m ODT q4 hours prn nausea/vomit 01/23/18  Yes Ford, Kelsey N, PA-C  predniSONE (DELTASONE) 10 MG tablet Take 10 mg by mouth 2 (two) times daily with a meal. 7 day course   Yes [provider]  vedolizumab (ENTYVIO) 300 MG injection Inject 300 mg into the vein as directed. By dermatologist   Yes [provider]  clindamycin (CLEOCIN T) 1 % SWAB APPLY TO FACE TWICE DAILY FOR ACNE Patient not taking: Apply to face twice daily for acne 11/27/17   NDickie La MD    Family History Family History  Problem Relation Age of Onset  . Diverticulitis Father        since 2000  . Prostate cancer Maternal Grandfather   . Lung cancer Paternal Grandfather        took vocal cords out  . Colon cancer Neg Hx     Social History Social History   Tobacco Use  . Smoking status: Never Smoker  . Smokeless tobacco: Never Used  . Tobacco comment: tried it once   Substance Use Topics  . Alcohol use: Yes  Comment: once a week  . Drug use: Not Currently    Comment: have used marijuana for stomach issues     Allergies   Patient has no known allergies.   Review of Systems Review of Systems  Constitutional: Positive for appetite change. Negative for fever.  Respiratory: Negative for cough.   Cardiovascular: Negative for chest pain.  Gastrointestinal: Positive for abdominal pain, constipation and nausea. Negative for diarrhea and vomiting.  Endocrine: Negative for polyuria.  Musculoskeletal: Positive for back pain.  Skin: Negative for rash.  Neurological: Negative for weakness and numbness.  Psychiatric/Behavioral: Negative for confusion.     Physical Exam Updated Vital Signs BP 127/81   Pulse (!) 58   Temp 98.3 F (36.8 C)  (Oral)   Resp 16   LMP 01/17/2018 Comment: neg hcg  SpO2 100%   Physical Exam HENT:     Head: Normocephalic.     Mouth/Throat:     Pharynx: No posterior oropharyngeal erythema.  Eyes:     Extraocular Movements: Extraocular movements intact.  Cardiovascular:     Rate and Rhythm: Normal rate.  Pulmonary:     Breath sounds: Normal breath sounds.  Abdominal:     Tenderness: There is abdominal tenderness.     Comments: Diffuse tenderness but worse in the left lower abdomen.  No rebound or guarding.  No hernia palpated.  Musculoskeletal:     Right lower leg: No edema.     Left lower leg: No edema.  Skin:    General: Skin is warm.     Capillary Refill: Capillary refill takes less than 2 seconds.  Neurological:     General: No focal deficit present.     Mental Status: She is alert.  Psychiatric:        Mood and Affect: Mood normal.      ED Treatments / Results  Labs (all labs ordered are listed, but only abnormal results are displayed) Labs Reviewed  URINALYSIS, ROUTINE W REFLEX MICROSCOPIC - Abnormal; Notable for the following components:      Result Value   APPearance HAZY (*)    Hgb urine dipstick SMALL (*)    Ketones, ur 5 (*)    Leukocytes, UA SMALL (*)    Bacteria, UA RARE (*)    All other components within normal limits  COMPREHENSIVE METABOLIC PANEL - Abnormal; Notable for the following components:   AST 13 (*)    All other components within normal limits  CBC WITH DIFFERENTIAL/PLATELET - Abnormal; Notable for the following components:   WBC 17.7 (*)    Neutro Abs 11.6 (*)    Lymphs Abs 4.4 (*)    Monocytes Absolute 1.4 (*)    Abs Immature Granulocytes 0.15 (*)    All other components within normal limits  SEDIMENTATION RATE  C-REACTIVE PROTEIN  LIPASE, BLOOD  I-STAT BETA HCG BLOOD, ED ([facility], [facility], [facility] ONLY)    EKG None  Radiology Ct Abdomen Pelvis W Contrast  Result Date: 02/01/2018 CLINICAL DATA:  Crohn disease presenting with abdominal pain and  constipation. EXAM: CT ABDOMEN AND PELVIS WITH CONTRAST TECHNIQUE: Multidetector CT imaging of the abdomen and pelvis was performed using the standard protocol following bolus administration of intravenous contrast. CONTRAST:  171m ISOVUE-300 IOPAMIDOL (ISOVUE-300) INJECTION 61%, 354mOMNIPAQUE IOHEXOL 300 MG/ML SOLN COMPARISON:  01/23/2018 CT abdomen/pelvis. FINDINGS: Lower chest: No significant pulmonary nodules or acute consolidative airspace disease. Hepatobiliary: Normal liver size. Subcentimeter hypodense posterior right liver dome lesion is too small to characterize and unchanged.  No new liver lesions. Normal gallbladder with no radiopaque cholelithiasis. No biliary ductal dilatation. Pancreas: Normal, with no mass or duct dilation. Spleen: Normal size. No mass. Adrenals/Urinary Tract: Normal adrenals. Normal kidneys with no hydronephrosis and no renal mass. Normal bladder. Stomach/Bowel: Normal non-distended stomach. Normal caliber small bowel with no small bowel wall thickening. Terminal ileum appears normal. Candidate appendix appears normal. Oral contrast transits to the rectum. There is segmental wall thickening in the distal sigmoid colon eccentrically involving the mesenteric side with associated minimal pericolonic fat stranding, stable to minimally worsened since 01/23/2018 CT. No additional sites of large bowel or rectal wall thickening. No evidence of a bowel fistula. Vascular/Lymphatic: Normal caliber abdominal aorta. Patent portal, splenic, hepatic and renal veins. No pathologically enlarged lymph nodes in the abdomen or pelvis. Reproductive: Intrauterine device appears grossly well-positioned within the uterine cavity. No adnexal abnormality. Other: No pneumoperitoneum, ascites or focal fluid collection. Musculoskeletal: No aggressive appearing focal osseous lesions. IMPRESSION: Segmental wall thickening in the distal sigmoid colon eccentrically involving the mesenteric side with associated  minimal pericolonic fat stranding, stable to minimally worsened since 01/23/2018 CT study, compatible with active Crohn disease. No new sites of disease. No evidence of bowel fistula, abscess or bowel obstruction. Electronically Signed   By: Ilona Sorrel M.D.   On: 02/01/2018 14:45    Procedures Procedures (including critical care time)  Medications Ordered in ED Medications  iopamidol (ISOVUE-300) 61 % injection (has no administration in time range)  sodium chloride (PF) 0.9 % injection (has no administration in time range)  ondansetron (ZOFRAN) injection 4 mg (4 mg Intravenous Given 02/01/18 1134)  fentaNYL (SUBLIMAZE) injection 50 mcg (50 mcg Intravenous Given 02/01/18 1135)  sodium chloride 0.9 % bolus 500 mL (500 mLs Intravenous Bolus 02/01/18 1134)  iohexol (OMNIPAQUE) 300 MG/ML solution 30 mL (30 mLs Oral Contrast Given 02/01/18 1142)  iopamidol (ISOVUE-300) 61 % injection 100 mL (100 mLs Intravenous Contrast Given 02/01/18 1410)     Initial Impression / Assessment and Plan / ED Course  I have reviewed the triage vital signs and the nursing notes.  Pertinent labs & imaging results that were available during my care of the patient were reviewed by me and considered in my medical decision making (see chart for details).     Patient abdominal pain.  Crohn's disease.  White count somewhat elevated although has normal sed rate and CRP.  Seen by gastroenterology.  However continued pain.  Inpatient treatment recommended by GI.  May need further intervention.  Primary is family practice so will admit to hospitalist.  Final GI recommendations still pending.  Final Clinical Impressions(s) / ED Diagnoses   Final diagnoses:  Crohn's disease of large intestine with complication ([place])  Abdominal pain, unspecified abdominal location    ED Discharge Orders    None       Davonna Belling, MD 02/01/18 1500

## 2018-02-01 NOTE — ED Notes (Signed)
ED TO INPATIENT HANDOFF REPORT  Name/Age/Gender Leslie Bolton [birth year] 28 y.o. female  Code Status   Home/SNF/Other Home  Chief Complaint abd pain   Level of Care/Admitting Diagnosis ED Disposition    ED Disposition Condition [facility]: [facility] [[id number]]  Level of Care: Med-Surg [16]  Diagnosis: Colonic obstruction [facility]) [[id number]]  Admitting Physician: Edwin Dada [[id number]]  Attending Physician: Edwin Dada [[id number]]  PT Class (Do Not Modify): Observation [104]  PT Acc Code (Do Not Modify): Observation [10022]       Medical History Past Medical History:  Diagnosis Date  . Anxiety   . Crohn's disease ([place])   . Depression     Allergies No Known Allergies  IV Location/Drains/Wounds Patient Lines/Drains/Airways Status   Active Line/Drains/Airways    Name:   Placement date:   Placement time:   Site:   Days:   Peripheral IV 02/01/18 Left Antecubital   02/01/18    1134    Antecubital   less than 1          Labs/Imaging Results for orders placed or performed during the hospital encounter of 02/01/18 (from the past 48 hour(s))  Sedimentation rate     Status: None   Collection Time: 02/01/18 11:33 AM  Result Value Ref Range   Sed Rate 0 0 - 22 mm/hr    Comment: Performed at [facility], [place] [address], [place], [place] [address]  C-reactive protein     Status: None   Collection Time: 02/01/18 11:33 AM  Result Value Ref Range   CRP <0.08 <1.0 mg/dL    Comment: Performed at [facility], [place] [address] [place], [place] [address]  Urinalysis, Routine w reflex microscopic     Status: Abnormal   Collection Time: 02/01/18 11:33 AM  Result Value Ref Range   Color, Urine YELLOW YELLOW   APPearance HAZY (A) CLEAR   Specific Gravity, Urine 1.029 1.005 - 1.030   pH 5.0 5.0 - 8.0   Glucose, UA NEGATIVE NEGATIVE mg/dL   Hgb urine dipstick SMALL (A) NEGATIVE   Bilirubin  Urine NEGATIVE NEGATIVE   Ketones, ur 5 (A) NEGATIVE mg/dL   Protein, ur NEGATIVE NEGATIVE mg/dL   Nitrite NEGATIVE NEGATIVE   Leukocytes, UA SMALL (A) NEGATIVE   RBC / HPF 0-5 0 - 5 RBC/hpf   WBC, UA 0-5 0 - 5 WBC/hpf   Bacteria, UA RARE (A) NONE SEEN   Squamous Epithelial / LPF 0-5 0 - 5   Mucus PRESENT     Comment: Performed at [facility], [place] [address] [place],  [address]  Comprehensive metabolic panel     Status: Abnormal   Collection Time: 02/01/18 11:33 AM  Result Value Ref Range   Sodium 139 135 - 145 mmol/L   Potassium 3.5 3.5 - 5.1 mmol/L   Chloride 104 98 - 111 mmol/L   CO2 25 22 - 32 mmol/L   Glucose, Bld 86 70 - 99 mg/dL   BUN 16 6 - 20 mg/dL   Creatinine, Ser 0.85 0.44 - 1.00 mg/dL   Calcium 9.5 8.9 - 10.3 mg/dL   Total Protein 7.8 6.5 - 8.1 g/dL   Albumin 4.7 3.5 - 5.0 g/dL   AST 13 (L) 15 - 41 U/L   ALT 12 0 - 44 U/L   Alkaline Phosphatase 52 38 - 126 U/L   Total Bilirubin 1.2 0.3 - 1.2 mg/dL  GFR calc non Af Amer >60 >60 mL/min   GFR calc Af Amer >60 >60 mL/min   Anion gap 10 5 - 15    Comment: Performed at [facility], [place] [address] [place], [place] [address]  CBC with Differential     Status: Abnormal   Collection Time: 02/01/18 11:33 AM  Result Value Ref Range   WBC 17.7 (H) 4.0 - 10.5 K/uL   RBC 5.00 3.87 - 5.11 MIL/uL   Hemoglobin 14.6 12.0 - 15.0 g/dL   HCT 44.4 36.0 - 46.0 %   MCV 88.8 80.0 - 100.0 fL   MCH 29.2 26.0 - 34.0 pg   MCHC 32.9 30.0 - 36.0 g/dL   RDW 12.4 11.5 - 15.5 %   Platelets 397 150 - 400 K/uL   nRBC 0.0 0.0 - 0.2 %   Neutrophils Relative % 65 %   Neutro Abs 11.6 (H) 1.7 - 7.7 K/uL   Lymphocytes Relative 25 %   Lymphs Abs 4.4 (H) 0.7 - 4.0 K/uL   Monocytes Relative 8 %   Monocytes Absolute 1.4 (H) 0.1 - 1.0 K/uL   Eosinophils Relative 1 %   Eosinophils Absolute 0.2 0.0 - 0.5 K/uL   Basophils Relative 0 %   Basophils Absolute 0.1 0.0 - 0.1 K/uL   Immature Granulocytes 1  %   Abs Immature Granulocytes 0.15 (H) 0.00 - 0.07 K/uL    Comment: Performed at [facility], [place] [address], [place], [place] [address]  Lipase, blood     Status: None   Collection Time: 02/01/18 11:33 AM  Result Value Ref Range   Lipase 26 11 - 51 U/L    Comment: Performed at [facility], [place] [address] [place], [place] [address]  I-Stat beta hCG blood, ED     Status: None   Collection Time: 02/01/18 11:45 AM  Result Value Ref Range   I-stat hCG, quantitative <5.0 <5 mIU/mL   Comment 3            Comment:   GEST. AGE      CONC.  (mIU/mL)   <=1 WEEK        5 - 50     2 WEEKS       50 - 500     3 WEEKS       100 - 10,000     4 WEEKS     1,000 - 30,000        FEMALE AND NON-PREGNANT FEMALE:     LESS THAN 5 mIU/mL    Ct Abdomen Pelvis W Contrast  Result Date: 02/01/2018 CLINICAL DATA:  Crohn disease presenting with abdominal pain and constipation. EXAM: CT ABDOMEN AND PELVIS WITH CONTRAST TECHNIQUE: Multidetector CT imaging of the abdomen and pelvis was performed using the standard protocol following bolus administration of intravenous contrast. CONTRAST:  185m ISOVUE-300 IOPAMIDOL (ISOVUE-300) INJECTION 61%, 361mOMNIPAQUE IOHEXOL 300 MG/ML SOLN COMPARISON:  01/23/2018 CT abdomen/pelvis. FINDINGS: Lower chest: No significant pulmonary nodules or acute consolidative airspace disease. Hepatobiliary: Normal liver size. Subcentimeter hypodense posterior right liver dome lesion is too small to characterize and unchanged. No new liver lesions. Normal gallbladder with no radiopaque cholelithiasis. No biliary ductal dilatation. Pancreas: Normal, with no mass or duct dilation. Spleen: Normal size. No mass. Adrenals/Urinary Tract: Normal adrenals. Normal kidneys with no hydronephrosis and no renal mass. Normal bladder. Stomach/Bowel: Normal non-distended stomach. Normal caliber small bowel with no small bowel wall thickening. Terminal ileum appears  normal.  Candidate appendix appears normal. Oral contrast transits to the rectum. There is segmental wall thickening in the distal sigmoid colon eccentrically involving the mesenteric side with associated minimal pericolonic fat stranding, stable to minimally worsened since 01/23/2018 CT. No additional sites of large bowel or rectal wall thickening. No evidence of a bowel fistula. Vascular/Lymphatic: Normal caliber abdominal aorta. Patent portal, splenic, hepatic and renal veins. No pathologically enlarged lymph nodes in the abdomen or pelvis. Reproductive: Intrauterine device appears grossly well-positioned within the uterine cavity. No adnexal abnormality. Other: No pneumoperitoneum, ascites or focal fluid collection. Musculoskeletal: No aggressive appearing focal osseous lesions. IMPRESSION: Segmental wall thickening in the distal sigmoid colon eccentrically involving the mesenteric side with associated minimal pericolonic fat stranding, stable to minimally worsened since 01/23/2018 CT study, compatible with active Crohn disease. No new sites of disease. No evidence of bowel fistula, abscess or bowel obstruction. Electronically Signed   By: Ilona Sorrel M.D.   On: 02/01/2018 14:45   None  Pending Labs Unresulted Labs (From admission, onward)   None      Vitals/Pain Today's Vitals   02/01/18 1430 02/01/18 1435 02/01/18 1500 02/01/18 1530  BP: 127/81 127/81 126/81 120/90  Pulse: 61 (!) 58 64 65  Resp:  16    Temp:      TempSrc:      SpO2: 100% 100% 100% 100%  PainSc:        Isolation Precautions No active isolations  Medications Medications  iopamidol (ISOVUE-300) 61 % injection (has no administration in time range)  sodium chloride (PF) 0.9 % injection (has no administration in time range)  ondansetron (ZOFRAN) injection 4 mg (4 mg Intravenous Given 02/01/18 1134)  fentaNYL (SUBLIMAZE) injection 50 mcg (50 mcg Intravenous Given 02/01/18 1135)  sodium chloride 0.9 % bolus 500 mL (500 mLs  Intravenous Bolus 02/01/18 1134)  iohexol (OMNIPAQUE) 300 MG/ML solution 30 mL (30 mLs Oral Contrast Given 02/01/18 1142)  iopamidol (ISOVUE-300) 61 % injection 100 mL (100 mLs Intravenous Contrast Given 02/01/18 1410)    Mobility walks

## 2018-02-01 NOTE — H&P (Signed)
History and Physical  Patient Name: Leslie Bolton     JIR:[id number]    DOB: Nov 14, 1990    DOA: 02/01/2018 PCP: [name], MD  Patient coming from: Home  Chief Complaint: Abdominal pain      HPI: Leslie Bolton is a 28 y.o. F with Crohn's disease with stricture who presents with abdominal pain for 10 days.  The patient was given a presumptive diagnosis of Crohn's disease by CT scan about 10 years ago, never followed up for colonoscopy has not been on treatment.  Then around October of this year, her symptoms had become unbearable (frequent stools, abdominal pain) that she sought medical evaluation.  On colonoscopy, she was found to have a colonic stricture, scope could not traverse.  General Surgery were consulted, but decision was made to try biologic therapy first, which began sometime in October.  Since then, she has been stable until about 2 weeks ago, when she started to have worsening crampy/intermittent diffuse abdominal pain.  Then about 10 days ago, she stopped passing stool.  Had urgency but could only pass mucus without blood.   One week ago, had CT that showed mild inflammation only, was treated with 7 days prednisone.  In that time, her pain has slowly worsened.  She has vomiting with every meal, improved with Zofran. Now returns for evalaution.  ED course: - Afebrile, heart rate 73, respirations and pulse ox normal, blood pressure 131/93 -Na 139, K 3.5, Cr 0.8, WBC 17.7K, Hgb 14.6 - Lipase normal - Urinalysis without red cells or white cells - Pregnancy test negative - CRP undetectable and sed rate 0 -CT abdomen and pelvis after the administration of IV contrast showed segmental sigmoid thickening and stranding consistent with Crohn's, no fistula, obstruction, abscess - She was evaluated by gastroenterology, who recommended observation in the hospital, likely bowel cleanout and general surgery consultation     ROS: Review of Systems  Constitutional: Negative for  fever.  Gastrointestinal: Positive for abdominal pain, constipation, nausea and vomiting. Negative for blood in stool, diarrhea, heartburn and melena.  All other systems reviewed and are negative.         Past Medical History:  Diagnosis Date  . Anxiety   . Crohn's disease ([place])   . Depression     Past Surgical History:  Procedure Laterality Date  . WISDOM TOOTH EXTRACTION     removal of all 4. Age 42    Social History: Patient lives with her partner.  She works at [employer].  The patient walks unassisted.  Nonsmoker.  No Known Allergies  Family history: family history includes Diverticulitis in her father; Lung cancer in her paternal grandfather; Prostate cancer in her maternal grandfather.  Prior to Admission medications   Medication Sig Start Date End Date Taking? Authorizing Provider  dicyclomine (BENTYL) 10 MG capsule Take 1 capsule (10 mg total) by mouth 3 (three) times daily as needed for up to 7 days for spasms. 01/23/18 02/01/18 Yes Jacqlyn Larsen, PA-C  ibuprofen (ADVIL,MOTRIN) 200 MG tablet Take 400 mg by mouth every 6 (six) hours as needed for headache or mild pain.   Yes [provider]  levonorgestrel (MIRENA) 20 MCG/24HR IUD 1 each by Intrauterine route once.   Yes [provider]  Multiple Vitamin (MULTIVITAMIN WITH MINERALS) TABS tablet Take 1 tablet by mouth daily.   Yes [provider]  ondansetron (ZOFRAN ODT) 4 MG disintegrating tablet 58m ODT q4 hours prn nausea/vomit 01/23/18  Yes FBenedetto Goad  N, PA-C  predniSONE (DELTASONE) 10 MG tablet Take 10 mg by mouth 2 (two) times daily with a meal. 7 day course   Yes [provider]  vedolizumab (ENTYVIO) 300 MG injection Inject 300 mg into the vein as directed. By dermatologist   Yes [provider]  clindamycin (CLEOCIN T) 1 % SWAB APPLY TO FACE TWICE DAILY FOR ACNE Patient not taking: Apply to face twice daily for acne 11/27/17   [name], MD        Physical Exam: BP 120/90   Pulse 65   Temp 98.3 F (36.8 C) (Oral)   Resp 16   LMP 01/17/2018 Comment: neg hcg  SpO2 100%  General appearance: Well-developed, adult female, alert and in moderate distress from pain intermittently.   Eyes: Anicteric, conjunctiva pink, lids and lashes normal. PERRL.    ENT: No nasal deformity, discharge, epistaxis.  Hearing normal. OP moist without lesions.  Dentition good, lips normal. Neck: No neck masses.  Trachea midline.  No thyromegaly/tenderness. Lymph: No cervical or supraclavicular lymphadenopathy. Skin: Warm and dry.  No jaundice.  No suspicious rashes or lesions. Cardiac: RRR, nl S1-S2, no murmurs appreciated.  Capillary refill is brisk.  JVP normal.  No LE edema.  Radial pulses 2+ and symmetric. Respiratory: Normal respiratory rate and rhythm.  CTAB without rales or wheezes. Abdomen: Abdomen soft.  Severe diffuse TTP with voluntary guarding.  No rigidity or rebound. MSK: No deformities or effusions.  No cyanosis or clubbing. Neuro: Cranial nerves normal.  Sensation intact to light touch. Speech is fluent.  Muscle strength normal.    Psych: Sensorium intact and responding to questions, attention normal.  Behavior appropriate.  Affect normal.  Judgment and insight appear normal.     Labs on Admission:  I have personally reviewed following labs and imaging studies: CBC: Recent Labs  Lab 02/01/18 1133  WBC 17.7*  NEUTROABS 11.6*  HGB 14.6  HCT 44.4  MCV 88.8  PLT 115   Basic Metabolic Panel: Recent Labs  Lab 02/01/18 1133  NA 139  K 3.5  CL 104  CO2 25  GLUCOSE 86  BUN 16  CREATININE 0.85  CALCIUM 9.5   GFR: Estimated Creatinine Clearance: 91.4 mL/min (by C-G formula based on SCr of 0.85 mg/dL).  Liver Function Tests: Recent Labs  Lab 02/01/18 1133  AST 13*  ALT 12  ALKPHOS 52  BILITOT 1.2  PROT 7.8  ALBUMIN 4.7   Recent Labs  Lab 02/01/18 1133  LIPASE 26             Radiological Exams on  Admission: Personally reviewed CT exam, shows some stool burden behind stricture, passage of contrast to rectum: Ct Abdomen Pelvis W Contrast  Result Date: 02/01/2018 CLINICAL DATA:  Crohn disease presenting with abdominal pain and constipation. EXAM: CT ABDOMEN AND PELVIS WITH CONTRAST TECHNIQUE: Multidetector CT imaging of the abdomen and pelvis was performed using the standard protocol following bolus administration of intravenous contrast. CONTRAST:  149m ISOVUE-300 IOPAMIDOL (ISOVUE-300) INJECTION 61%, 317mOMNIPAQUE IOHEXOL 300 MG/ML SOLN COMPARISON:  01/23/2018 CT abdomen/pelvis. FINDINGS: Lower chest: No significant pulmonary nodules or acute consolidative airspace disease. Hepatobiliary: Normal liver size. Subcentimeter hypodense posterior right liver dome lesion is too small to characterize and unchanged. No new liver lesions. Normal gallbladder with no radiopaque cholelithiasis. No biliary ductal dilatation. Pancreas: Normal, with no mass or duct dilation. Spleen: Normal size. No mass. Adrenals/Urinary Tract: Normal adrenals. Normal kidneys with no hydronephrosis and no renal mass. Normal  bladder. Stomach/Bowel: Normal non-distended stomach. Normal caliber small bowel with no small bowel wall thickening. Terminal ileum appears normal. Candidate appendix appears normal. Oral contrast transits to the rectum. There is segmental wall thickening in the distal sigmoid colon eccentrically involving the mesenteric side with associated minimal pericolonic fat stranding, stable to minimally worsened since 01/23/2018 CT. No additional sites of large bowel or rectal wall thickening. No evidence of a bowel fistula. Vascular/Lymphatic: Normal caliber abdominal aorta. Patent portal, splenic, hepatic and renal veins. No pathologically enlarged lymph nodes in the abdomen or pelvis. Reproductive: Intrauterine device appears grossly well-positioned within the uterine cavity. No adnexal abnormality. Other: No  pneumoperitoneum, ascites or focal fluid collection. Musculoskeletal: No aggressive appearing focal osseous lesions. IMPRESSION: Segmental wall thickening in the distal sigmoid colon eccentrically involving the mesenteric side with associated minimal pericolonic fat stranding, stable to minimally worsened since 01/23/2018 CT study, compatible with active Crohn disease. No new sites of disease. No evidence of bowel fistula, abscess or bowel obstruction. Electronically Signed   By: Ilona Sorrel M.D.   On: 02/01/2018 14:45           Assessment/Plan  Colonic obstruction due to Crohn's disease  Newly on treatment.  Known stricture from her colonoscopy in October.  This seems to be a mechanical issue, and I agree with General Surgery consultation.  GI have spoken with GS already. -Clear liquid diet -Gentle IVF -Will defer decision re: barium enema or not to GI -Will defer decision re: PEG enema vs oral PEG to GI  -Oxycodone or morphine for pain, ondansetron for nausea       DVT prophylaxis: Lovenox  Code Status: FULL  Family Communication: Husband at bedside  Disposition Plan: Anticipate IV fluids, bowel rest, General Surgery consultation, bowel cleanout per General Surgery and GI. Consults called: GI, General Surgery Admission status: OBS At the point of initial evaluation, it is my clinical opinion that admission for OBSERVATION is reasonable and necessary because the patient's presenting complaints in the context of their chronic conditions represent sufficient risk of deterioration or significant morbidity to constitute reasonable grounds for close observation in the hospital setting, but that the patient may be medically stable for discharge from the hospital within 24 to 48 hours.    Medical decision making: Patient seen at 3:47 PM on 02/01/2018.  The patient was discussed with Ellouise Newer and Dr. Alvino Chapel.  What exists of the patient's chart was reviewed in depth and summarized  above.  Clinical condition: stable.        Edwin Dada [facility] Pager [phone]

## 2018-02-01 NOTE — ED Triage Notes (Signed)
Pt was told to come to Ed by her GI doctor for no BM in 11 days and abd pains. Reports was seen at [facility] Christmas Eve.

## 2018-02-01 NOTE — Progress Notes (Signed)
Brief additional note: Ct results back with continued stricture-suspect likely fibrotic, though will try IV steroids initially.  Started Solumedrol 61m IV q8 hours Started full liquid diet Ordered Fleet enema x1 tonight Started Moviprep to be sipped overnight Agree with prn pain meds and antiemetics  Discussed plan of care with the patient, her mother and husband. They are in agreement.  JEllouise Newer PA-C [facility]

## 2018-02-01 NOTE — Telephone Encounter (Signed)
Matter handled by MD;

## 2018-02-02 ENCOUNTER — Observation Stay (HOSPITAL_COMMUNITY): Payer: 59

## 2018-02-02 DIAGNOSIS — K50112 Crohn's disease of large intestine with intestinal obstruction: Secondary | ICD-10-CM

## 2018-02-02 DIAGNOSIS — F19982 Other psychoactive substance use, unspecified with psychoactive substance-induced sleep disorder: Secondary | ICD-10-CM

## 2018-02-02 DIAGNOSIS — R109 Unspecified abdominal pain: Secondary | ICD-10-CM | POA: Diagnosis not present

## 2018-02-02 DIAGNOSIS — K56609 Unspecified intestinal obstruction, unspecified as to partial versus complete obstruction: Secondary | ICD-10-CM

## 2018-02-02 DIAGNOSIS — K50119 Crohn's disease of large intestine with unspecified complications: Secondary | ICD-10-CM | POA: Diagnosis not present

## 2018-02-02 DIAGNOSIS — G4700 Insomnia, unspecified: Secondary | ICD-10-CM

## 2018-02-02 LAB — BASIC METABOLIC PANEL
Anion gap: 10 (ref 5–15)
BUN: 9 mg/dL (ref 6–20)
CALCIUM: 8.9 mg/dL (ref 8.9–10.3)
CHLORIDE: 103 mmol/L (ref 98–111)
CO2: 22 mmol/L (ref 22–32)
CREATININE: 0.76 mg/dL (ref 0.44–1.00)
GFR calc Af Amer: >60 mL/min (ref >60)
GFR calc non Af Amer: >60 mL/min (ref >60)
Glucose, Bld: 96 mg/dL (ref 70–99)
Potassium: 4.5 mmol/L (ref 3.5–5.1)
Sodium: 135 mmol/L (ref 135–145)

## 2018-02-02 LAB — CBC
HEMATOCRIT: 41.4 % (ref 36.0–46.0)
Hemoglobin: 13.1 g/dL (ref 12.0–15.0)
MCH: 28.5 pg (ref 26.0–34.0)
MCHC: 31.6 g/dL (ref 30.0–36.0)
MCV: 90 fL (ref 80.0–100.0)
Platelets: 339 10*3/uL (ref 150–400)
RBC: 4.6 MIL/uL (ref 3.87–5.11)
RDW: 12.4 % (ref 11.5–15.5)
WBC: 15.7 10*3/uL — ABNORMAL HIGH (ref 4.0–10.5)
nRBC: 0 % (ref 0.0–0.2)

## 2018-02-02 LAB — HIV ANTIBODY (ROUTINE TESTING W REFLEX): HIV Screen 4th Generation wRfx: NONREACTIVE

## 2018-02-02 MED ORDER — ONDANSETRON HCL 4 MG PO TABS: 4.0000 mg | ORAL_TABLET | Freq: Four times a day (QID) | ORAL | 0 refills | Status: DC | PRN

## 2018-02-02 MED ORDER — ADULT MULTIVITAMIN W/MINERALS CH: 1.0000 | ORAL_TABLET | Freq: Every day | ORAL | Status: DC

## 2018-02-02 MED ORDER — ENSURE ENLIVE PO LIQD: 237.0000 mL | Freq: Three times a day (TID) | ORAL | 12 refills | Status: DC

## 2018-02-02 MED ORDER — ZOLPIDEM TARTRATE 5 MG PO TABS: 5.0000 mg | ORAL_TABLET | Freq: Every day | ORAL | Status: DC

## 2018-02-02 MED ORDER — DIATRIZOATE MEGLUMINE & SODIUM 66-10 % PO SOLN
450.0000 mL | Freq: Once | ORAL | Status: AC
  Administered 2018-02-02: 450 mL via ORAL

## 2018-02-02 MED ORDER — MORPHINE SULFATE (PF) 4 MG/ML IV SOLN: 4.0000 mg | INTRAVENOUS | 0 refills | Status: DC | PRN

## 2018-02-02 MED ORDER — METHYLPREDNISOLONE SODIUM SUCC 40 MG IJ SOLR: 20.0000 mg | Freq: Three times a day (TID) | INTRAMUSCULAR | 0 refills | Status: DC

## 2018-02-02 MED ORDER — OXYCODONE HCL 5 MG PO TABS: 5.0000 mg | ORAL_TABLET | ORAL | 0 refills | Status: DC | PRN

## 2018-02-02 MED ORDER — DIATRIZOATE MEGLUMINE & SODIUM 66-10 % PO SOLN
ORAL | Status: AC
  Administered 2018-02-02: 450 mL via ORAL
  Filled 2018-02-02: qty 450

## 2018-02-02 MED ORDER — ZOLPIDEM TARTRATE 5 MG PO TABS: 5.0000 mg | ORAL_TABLET | Freq: Every day | ORAL | 0 refills | Status: DC

## 2018-02-02 MED ORDER — ENSURE ENLIVE PO LIQD
237.0000 mL | Freq: Three times a day (TID) | ORAL | Status: DC
  Administered 2018-02-02: 237 mL via ORAL

## 2018-02-02 MED ORDER — ONDANSETRON HCL 4 MG/2ML IJ SOLN: 4.0000 mg | Freq: Four times a day (QID) | INTRAMUSCULAR | 0 refills | Status: DC | PRN

## 2018-02-02 NOTE — Discharge Summary (Signed)
Physician Discharge Summary  Leslie Bolton IRC:[id number] DOB: Jul 13, 1990 DOA: 02/01/2018  PCP: [name], MD  Admit date: 02/01/2018 Discharge date: 02/02/2018  Admitted From: Home Disposition:  [facility]  Accepting physician: Dr. Melina Copa  Discharge Condition:Stable CODE STATUS:Full Diet recommendation: Full   Brief/Interim Summary: 28 y.o. F with Crohn's disease with stricture who presents with abdominal pain for 10 days.  The patient was given a presumptive diagnosis of Crohn's disease by CT scan about 10 years ago, never followed up for colonoscopy has not been on treatment.  Then around October of this year, her symptoms had become unbearable (frequent stools, abdominal pain) that she sought medical evaluation.  On colonoscopy, she was found to have a colonic stricture, scope could not traverse.  General Surgery were consulted, but decision was made to try biologic therapy first, which began sometime in October.  Since then, she has been stable until about 2 weeks ago, when she started to have worsening crampy/intermittent diffuse abdominal pain.  Then about 10 days ago, she stopped passing stool.  Had urgency but could only pass mucus without blood.   One week ago, had CT that showed mild inflammation only, was treated with 7 days prednisone.  In that time, her pain has slowly worsened.  She has vomiting with every meal, improved with Zofran. Now returns for evalaution.  ED course: - Afebrile, heart rate 73, respirations and pulse ox normal, blood pressure 131/93 -Na 139, K 3.5, Cr 0.8, WBC 17.7K, Hgb 14.6 - Lipase normal - Urinalysis without red cells or white cells - Pregnancy test negative - CRP undetectable and sed rate 0 -CT abdomen and pelvis after the administration of IV contrast showed segmental sigmoid thickening and stranding consistent with Crohn's, no fistula, obstruction, abscess - She was evaluated by gastroenterology, who recommended observation in  the hospital, likely bowel cleanout and general surgery consultation  Discharge Diagnoses:  Principal Problem:   Colonic obstruction ([place]) Active Problems:   Crohn's disease of large intestine with intestinal obstruction ([facility])   Drug-induced insomnia ([place])  Colonic obstruction due to Crohn's disease  Newly on treatment.  Known stricture from her colonoscopy in October.   -Initially continued on clear liquid diet and tolerated. Passed flatus -Gentle IVF -Oxycodone or morphine for pain, ondansetron for nausea -GI consulted and following -Patient underwent CT overnight with concerns of stricture, suspected fibrotic changes  -Pt otherwise continued on IV steroids -At the time of this dictation, patient is undergoing gastrograffen study -Given above findings, GI had discussed case with Dr. Blinda Leatherwood at [facility] who had accepted patient in transfer for further surgical management  Discharge Instructions   Allergies as of 02/02/2018   No Known Allergies     Medication List    STOP taking these medications   clindamycin 1 % Swab Commonly known as:  CLEOCIN T   levonorgestrel 20 MCG/24HR IUD Commonly known as:  MIRENA   ondansetron 4 MG disintegrating tablet Commonly known as:  ZOFRAN ODT   predniSONE 10 MG tablet Commonly known as:  DELTASONE   vedolizumab 300 MG injection Commonly known as:  ENTYVIO     TAKE these medications   dicyclomine 10 MG capsule Commonly known as:  BENTYL Take 1 capsule (10 mg total) by mouth 3 (three) times daily as needed for up to 7 days for spasms.   feeding supplement (ENSURE ENLIVE) Liqd Take 237 mLs by mouth 3 (three) times daily between meals.   ibuprofen 200 MG tablet Commonly  known as:  ADVIL,MOTRIN Take 400 mg by mouth every 6 (six) hours as needed for headache or mild pain.   methylPREDNISolone sodium succinate 40 mg/mL injection Commonly known as:  SOLU-MEDROL Inject 0.5 mLs (20 mg total) into the vein every 8  (eight) hours.   morphine 4 MG/ML injection Inject 1 mL (4 mg total) into the vein every 4 (four) hours as needed (breakthrough pain).   multivitamin with minerals Tabs tablet Take 1 tablet by mouth daily. What changed:  Another medication with the same name was added. Make sure you understand how and when to take each.   multivitamin with minerals Tabs tablet Take 1 tablet by mouth daily. What changed:  You were already taking a medication with the same name, and this prescription was added. Make sure you understand how and when to take each.   ondansetron 4 MG tablet Commonly known as:  ZOFRAN Take 1 tablet (4 mg total) by mouth every 6 (six) hours as needed for nausea.   ondansetron 4 MG/2ML Soln injection Commonly known as:  ZOFRAN Inject 2 mLs (4 mg total) into the vein every 6 (six) hours as needed for nausea.   oxyCODONE 5 MG immediate release tablet Commonly known as:  Oxy IR/ROXICODONE Take 1 tablet (5 mg total) by mouth every 4 (four) hours as needed for moderate pain.   zolpidem 5 MG tablet Commonly known as:  AMBIEN Take 1 tablet (5 mg total) by mouth at bedtime.       No Known Allergies  Consultations:  GI  Procedures/Studies: Ct Abdomen Pelvis W Contrast  Result Date: 02/01/2018 CLINICAL DATA:  Crohn disease presenting with abdominal pain and constipation. EXAM: CT ABDOMEN AND PELVIS WITH CONTRAST TECHNIQUE: Multidetector CT imaging of the abdomen and pelvis was performed using the standard protocol following bolus administration of intravenous contrast. CONTRAST:  181m ISOVUE-300 IOPAMIDOL (ISOVUE-300) INJECTION 61%, 33mOMNIPAQUE IOHEXOL 300 MG/ML SOLN COMPARISON:  01/23/2018 CT abdomen/pelvis. FINDINGS: Lower chest: No significant pulmonary nodules or acute consolidative airspace disease. Hepatobiliary: Normal liver size. Subcentimeter hypodense posterior right liver dome lesion is too small to characterize and unchanged. No new liver lesions. Normal  gallbladder with no radiopaque cholelithiasis. No biliary ductal dilatation. Pancreas: Normal, with no mass or duct dilation. Spleen: Normal size. No mass. Adrenals/Urinary Tract: Normal adrenals. Normal kidneys with no hydronephrosis and no renal mass. Normal bladder. Stomach/Bowel: Normal non-distended stomach. Normal caliber small bowel with no small bowel wall thickening. Terminal ileum appears normal. Candidate appendix appears normal. Oral contrast transits to the rectum. There is segmental wall thickening in the distal sigmoid colon eccentrically involving the mesenteric side with associated minimal pericolonic fat stranding, stable to minimally worsened since 01/23/2018 CT. No additional sites of large bowel or rectal wall thickening. No evidence of a bowel fistula. Vascular/Lymphatic: Normal caliber abdominal aorta. Patent portal, splenic, hepatic and renal veins. No pathologically enlarged lymph nodes in the abdomen or pelvis. Reproductive: Intrauterine device appears grossly well-positioned within the uterine cavity. No adnexal abnormality. Other: No pneumoperitoneum, ascites or focal fluid collection. Musculoskeletal: No aggressive appearing focal osseous lesions. IMPRESSION: Segmental wall thickening in the distal sigmoid colon eccentrically involving the mesenteric side with associated minimal pericolonic fat stranding, stable to minimally worsened since 01/23/2018 CT study, compatible with active Crohn disease. No new sites of disease. No evidence of bowel fistula, abscess or bowel obstruction. Electronically Signed   By: JaIlona Sorrel.D.   On: 02/01/2018 14:45   Ct Abdomen Pelvis W Contrast  Result  Date: 01/23/2018 CLINICAL DATA:  Abdominal pain, acute, generalized. Recent diagnosis of Crohn's disease. EXAM: CT ABDOMEN AND PELVIS WITH CONTRAST TECHNIQUE: Multidetector CT imaging of the abdomen and pelvis was performed using the standard protocol following bolus administration of intravenous  contrast. CONTRAST:  132m OMNIPAQUE IOHEXOL 300 MG/ML  SOLN COMPARISON:  CT of the abdomen 11/22/2017 FINDINGS: Lower chest: The lung bases are clear without focal nodule, mass, or airspace disease. The heart size is normal. No significant pleural or pericardial effusion is present. Hepatobiliary: No focal liver abnormality is seen. No gallstones, gallbladder wall thickening, or biliary dilatation. Pancreas: Unremarkable. No pancreatic ductal dilatation or surrounding inflammatory changes. Spleen: Normal in size without focal abnormality. Adrenals/Urinary Tract: Adrenal glands are normal bilaterally. Kidneys are unremarkable. There is no stone or mass lesion. Ureters are within normal limits. The urinary bladder is normal. Stomach/Bowel: Stomach and duodenum are within normal limits. Small bowel is unremarkable. Terminal ileum is within normal limits. The appendix is visualized and normal. Ascending transverse colon are normal. Descending and sigmoid colon are within normal limits. The descending colon is within normal limits. Asymmetric wall thickening previously noted in the sigmoid colon has improved. There is mild stranding with small lymph nodes in the small bowel mesentery. Vascular/Lymphatic: No significant vascular findings are present. No enlarged abdominal or pelvic lymph nodes. Reproductive: IUD is in place.  Adnexa are unremarkable. Other: No abdominal wall hernia or abnormality. No abdominopelvic ascites. Musculoskeletal: Vertebral body heights and alignment are maintained. Pelvis is within normal limits. Hips are located and unremarkable. IMPRESSION: 1. Wall thickening inflammatory changes of the sigmoid colon is improved. No new segments are involved. 2. Mild stranding and small lymph nodes within the small bowel mesentery may be reactive. No focal bowel lesions are evident. 3. Otherwise normal CT of the abdomen pelvis. No other acute or focal lesion to explain the patient's abdominal pain.  Electronically Signed   By: CSan MorelleM.D.   On: 01/23/2018 13:59    Subjective: Reports flatus today  Discharge Exam: Vitals:   02/02/18 0552 02/02/18 1424  BP: 108/69 (!) 137/98  Pulse: 67 (!) 55  Resp: 15 15  Temp: 97.7 F (36.5 C) 98 F (36.7 C)  SpO2: 100% 99%   Vitals:   02/01/18 1756 02/01/18 2125 02/02/18 0552 02/02/18 1424  BP:  118/84 108/69 (!) 137/98  Pulse:  (!) 52 67 (!) 55  Resp:  17 15 15   Temp:  98.3 F (36.8 C) 97.7 F (36.5 C) 98 F (36.7 C)  TempSrc:  Oral Oral   SpO2:  99% 100% 99%  Weight: 67.1 kg     Height: 5' 6"  (1.676 m)       General: Pt is alert, awake, not in acute distress Cardiovascular: RRR, S1/S2 +, no rubs, no gallops Respiratory: CTA bilaterally, no wheezing, no rhonchi Abdominal: Soft, NT, ND, bowel sounds + Extremities: no edema, no cyanosis   The results of significant diagnostics from this hospitalization (including imaging, microbiology, ancillary and laboratory) are listed below for reference.     Microbiology: No results found for this or any previous visit (from the past 240 hour(s)).   Labs: BNP (last 3 results) No results for input(s): BNP in the last 8760 hours. Basic Metabolic Panel: Recent Labs  Lab 02/01/18 1133 02/02/18 0536  NA 139 135  K 3.5 4.5  CL 104 103  CO2 25 22  GLUCOSE 86 96  BUN 16 9  CREATININE 0.85 0.76  CALCIUM 9.5 8.9  Liver Function Tests: Recent Labs  Lab 02/01/18 1133  AST 13*  ALT 12  ALKPHOS 52  BILITOT 1.2  PROT 7.8  ALBUMIN 4.7   Recent Labs  Lab 02/01/18 1133  LIPASE 26   No results for input(s): AMMONIA in the last 168 hours. CBC: Recent Labs  Lab 02/01/18 1133 02/02/18 0536  WBC 17.7* 15.7*  NEUTROABS 11.6*  --   HGB 14.6 13.1  HCT 44.4 41.4  MCV 88.8 90.0  PLT 397 339   Cardiac Enzymes: No results for input(s): CKTOTAL, CKMB, CKMBINDEX, TROPONINI in the last 168 hours. BNP: Invalid input(s): POCBNP CBG: No results for input(s): GLUCAP  in the last 168 hours. D-Dimer No results for input(s): DDIMER in the last 72 hours. Hgb A1c No results for input(s): HGBA1C in the last 72 hours. Lipid Profile No results for input(s): CHOL, HDL, LDLCALC, TRIG, CHOLHDL, LDLDIRECT in the last 72 hours. Thyroid function studies No results for input(s): TSH, T4TOTAL, T3FREE, THYROIDAB in the last 72 hours.  Invalid input(s): FREET3 Anemia work up No results for input(s): VITAMINB12, FOLATE, FERRITIN, TIBC, IRON, RETICCTPCT in the last 72 hours. Urinalysis    Component Value Date/Time   COLORURINE YELLOW 02/01/2018 1133   APPEARANCEUR HAZY (A) 02/01/2018 1133   LABSPEC 1.029 02/01/2018 1133   PHURINE 5.0 02/01/2018 1133   GLUCOSEU NEGATIVE 02/01/2018 1133   HGBUR SMALL (A) 02/01/2018 1133   HGBUR negative 12/05/2008 1502   BILIRUBINUR NEGATIVE 02/01/2018 1133   KETONESUR 5 (A) 02/01/2018 1133   PROTEINUR NEGATIVE 02/01/2018 1133   UROBILINOGEN 0.2 12/05/2008 1502   NITRITE NEGATIVE 02/01/2018 1133   LEUKOCYTESUR SMALL (A) 02/01/2018 1133   Sepsis Labs Invalid input(s): PROCALCITONIN,  WBC,  LACTICIDVEN Microbiology No results found for this or any previous visit (from the past 240 hour(s)).  Time spent: 30 min  SIGNED:   Marylu Lund, MD  [facility] 02/02/2018, 2:55 PM  If 7PM-7AM, please contact night-coverage

## 2018-02-02 NOTE — Plan of Care (Signed)
Pt is stable at this time. Pain management in progress, effective. Plan of care reviewed.

## 2018-02-02 NOTE — Progress Notes (Signed)
Initial Nutrition Assessment  DOCUMENTATION CODES:   Not applicable  INTERVENTION:    Ensure Enlive po TID, each supplement provides 350 kcal and 20 grams of protein  Provide MVI daily  NUTRITION DIAGNOSIS:   Inadequate oral intake related to vomiting, nausea as evidenced by per patient/family report.  GOAL:   Patient will meet greater than or equal to 90% of their needs  MONITOR:   PO intake, Supplement acceptance, Weight trends, Labs, Diet advancement  REASON FOR ASSESSMENT:   Malnutrition Screening Tool    ASSESSMENT:   Patient with PMH significant for anxiety, depression, and Crohn's disease with a stricture (found by coloscopy in October). Presents this admission with colonic obstruction due to Crohn's.    Pt without BM in 11 days PTA but had one this morning. Pt endorses her appetite fluctuates depending on her pain level. She avoids spicy foods, popcorn, pancakes, and sometimes seedy foods. Over the last week she has not consumed much due to pain and vomiting. Vomiting has resolved but pt remains nauseous.  Per GI, pt may go home on liquid diet until she can follow up outpatient with [facility]. Reviewed clear and full liquids. Recommended use of a liquid multivitamin while following a liquid diet. Reviewed acceptable protein supplements. Pt uses Ensure at home when her appetite decreases. Encouraged increasing intake while on liquid diet to maximize protein.   Pt unable to quantify UBW due to her weight fluctuating so often. She states if she has to guess she has lost ~30 lb in a years time frame. Records show pt weighing 153 lb in October and 148 lb this admission.   Unable to complete Nutrition-Focused physical exam at this time due to pt being extremely nauseous requiring Zofran. Will attempt on follow up.   Medications reviewed and include: solumedrol, NS @ 75 ml/hr Labs reviewed.   Diet Order:   Diet Order            Diet full liquid Room service  appropriate? Yes; Fluid consistency: Thin  Diet effective now              EDUCATION NEEDS:   Education needs have been addressed  Skin:  Skin Assessment: Reviewed RN Assessment  Last BM:  02/02/17  Height:   Ht Readings from Last 1 Encounters:  02/01/18 5' 6"  (1.676 m)    Weight:   Wt Readings from Last 1 Encounters:  02/01/18 67.1 kg    Ideal Body Weight:  59.1 kg  BMI:  Body mass index is 23.89 kg/m.  Estimated Nutritional Needs:   Kcal:  1700-1900 kcal  Protein:  85-100 grams  Fluid:  >/= 1.7 L/day   Mariana Single RD, LDN Clinical Nutrition Pager # - [phone]

## 2018-02-02 NOTE — Progress Notes (Addendum)
Progress Note   Subjective  Chief Complaint: Colonic Crohn's disease with stricturing  Patient reports that she has had some bowel movement as of this morning, some solid mixed with some loose stool, this was somewhat painful when she passed it and she thinks possibly she can feel a tight spot where the stool passes through.  Continues with abdominal discomfort, though slightly decreased from yesterday.   Objective   Vital signs in last 24 hours: Temp:  [97.4 F (36.3 C)-98.3 F (36.8 C)] 97.7 F (36.5 C) (01/03 0552) Pulse Rate:  [51-67] 67 (01/03 0552) Resp:  [15-17] 15 (01/03 0552) BP: (102-137)/(69-96) 108/69 (01/03 0552) SpO2:  [99 %-100 %] 100 % (01/03 0552) Weight:  [67.1 kg] 67.1 kg (01/02 1756) Last BM Date: 02/02/18 General:    white female in NAD Heart:  Regular rate and rhythm; no murmurs Lungs: Respirations even and unlabored, lungs CTA bilaterally Abdomen:  Soft, moderate generalized ttp, worse in LLQ and nondistended. Decreased BS all four quadrants Extremities:  Without edema. Neurologic:  Alert and oriented,  grossly normal neurologically. Psych:  Cooperative. Normal mood and affect.  Intake/Output from previous day: 01/02 0701 - 01/03 0700 In: 1481.4 [P.O.:480; I.V.:1001.4] Out: 0   Lab Results: Recent Labs    02/01/18 1133 02/02/18 0536  WBC 17.7* 15.7*  HGB 14.6 13.1  HCT 44.4 41.4  PLT 397 339   BMET Recent Labs    02/01/18 1133 02/02/18 0536  NA 139 135  K 3.5 4.5  CL 104 103  CO2 25 22  GLUCOSE 86 96  BUN 16 9  CREATININE 0.85 0.76  CALCIUM 9.5 8.9   LFT Recent Labs    02/01/18 1133  PROT 7.8  ALBUMIN 4.7  AST 13*  ALT 12  ALKPHOS 52  BILITOT 1.2   Studies/Results: Ct Abdomen Pelvis W Contrast  Result Date: 02/01/2018 CLINICAL DATA:  Crohn disease presenting with abdominal pain and constipation. EXAM: CT ABDOMEN AND PELVIS WITH CONTRAST TECHNIQUE: Multidetector CT imaging of the abdomen and pelvis was performed using  the standard protocol following bolus administration of intravenous contrast. CONTRAST:  181m ISOVUE-300 IOPAMIDOL (ISOVUE-300) INJECTION 61%, 359mOMNIPAQUE IOHEXOL 300 MG/ML SOLN COMPARISON:  01/23/2018 CT abdomen/pelvis. FINDINGS: Lower chest: No significant pulmonary nodules or acute consolidative airspace disease. Hepatobiliary: Normal liver size. Subcentimeter hypodense posterior right liver dome lesion is too small to characterize and unchanged. No new liver lesions. Normal gallbladder with no radiopaque cholelithiasis. No biliary ductal dilatation. Pancreas: Normal, with no mass or duct dilation. Spleen: Normal size. No mass. Adrenals/Urinary Tract: Normal adrenals. Normal kidneys with no hydronephrosis and no renal mass. Normal bladder. Stomach/Bowel: Normal non-distended stomach. Normal caliber small bowel with no small bowel wall thickening. Terminal ileum appears normal. Candidate appendix appears normal. Oral contrast transits to the rectum. There is segmental wall thickening in the distal sigmoid colon eccentrically involving the mesenteric side with associated minimal pericolonic fat stranding, stable to minimally worsened since 01/23/2018 CT. No additional sites of large bowel or rectal wall thickening. No evidence of a bowel fistula. Vascular/Lymphatic: Normal caliber abdominal aorta. Patent portal, splenic, hepatic and renal veins. No pathologically enlarged lymph nodes in the abdomen or pelvis. Reproductive: Intrauterine device appears grossly well-positioned within the uterine cavity. No adnexal abnormality. Other: No pneumoperitoneum, ascites or focal fluid collection. Musculoskeletal: No aggressive appearing focal osseous lesions. IMPRESSION: Segmental wall thickening in the distal sigmoid colon eccentrically involving the mesenteric side with associated minimal pericolonic fat stranding, stable to minimally worsened  since 01/23/2018 CT study, compatible with active Crohn disease. No new sites  of disease. No evidence of bowel fistula, abscess or bowel obstruction. Electronically Signed   By: Ilona Sorrel M.D.   On: 02/01/2018 14:45    Assessment / Plan:   Assessment: 1.  Colonic Crohn's disease with stricturing: Colonoscopy in October revealing stricture, previous diagnosis of Crohn's disease, status post 2 doses of Entyvio, stricture persists via CT 02/01/17, the patient is now passing stool after 11+ days of no bowel movement at all after enema yesterday, still has some generalized abdominal discomfort but this is slightly decreased from yesterday, CRP and ESR returned normal  Plan: 1.  Continue Solu-Medrol 20 mg every 8 hours 2.  Patient does request something to help her sleep as the Prednisone kept her up all night last night. 3.  Ordered a Gastrografin enema today.  Per radiology this should be around 1:00 4.  If patient continues to move her bowels, can start a slow bowel prep, movi-prep later today 5.   If patient continues to do well here, plans are for outpatient referral to Dr. Drue Flirt at [facility] who is a Sports administrator for further discussion in regards to stricture, Dr. Bryan Lemma plans to speak with her today hopefully. 6.  Continue full liquid diet for now.  Discussed that if the patient is discharged within the next 48 hours, she should remain on this diet until consultation with surgery. 7.  Patient is scheduled for her next Entyvio dose on 02/07/2018, she should keep this appointment. 8.  We will go ahead and cancel patient's appointment with Dr. Bryan Lemma scheduled for 02/06/2018.  She can follow-up with him shortly after seeing the surgeon so that we are all aware of plans. 9.  Please await any further recommendations from Dr. Carlean Purl who is rounding on patient today.  Dr. Bryan Lemma will follow over the weekend.  Thank you for kind consultation.  We will continue to follow along.    LOS: 0 days   Levin Erp  02/02/2018, 10:20 AM    [place]  GI Attending   I have taken an interval history, reviewed the chart and examined the patient. I agree with the Advanced Practitioner's note, impression and recommendations.   Will see if gastrograffen enema promotes better emptying of colon. Anticipate home when better defecation and improved abdominal exam. Was using ondansetron for nausea - ? If that has contributed any to constipation   Gatha Mayer, MD, [facility] 02/02/2018 11:03 AM Pager [phone]

## 2018-02-03 NOTE — Progress Notes (Signed)
Pt discharged to [facility] 9th floor, RM 916. Pt given AVS, EMTALA form & Discharge summary. IV saline locked and taped with gauze. VSS. Pts husband driving pt to facility, permission given by [facility] Luz Brazen. Report given to [place], RN @ 23:00.

## 2018-02-05 HISTORY — PX: COLON SURGERY: SHX602

## 2018-02-06 ENCOUNTER — Ambulatory Visit: Payer: 59 | Admitting: Gastroenterology

## 2018-02-19 ENCOUNTER — Telehealth: Payer: Self-pay | Admitting: Gastroenterology

## 2018-02-19 ENCOUNTER — Ambulatory Visit (INDEPENDENT_AMBULATORY_CARE_PROVIDER_SITE_OTHER): Payer: 59 | Admitting: Family Medicine

## 2018-02-19 ENCOUNTER — Encounter: Payer: Self-pay | Admitting: Family Medicine

## 2018-02-19 VITALS — BP 110/68 | HR 63 | Temp 98.1°F | Ht 66.0 in | Wt 148.2 lb

## 2018-02-19 DIAGNOSIS — Z9049 Acquired absence of other specified parts of digestive tract: Secondary | ICD-10-CM | POA: Diagnosis not present

## 2018-02-19 DIAGNOSIS — Z7689 Persons encountering health services in other specified circumstances: Secondary | ICD-10-CM | POA: Diagnosis not present

## 2018-02-19 DIAGNOSIS — F341 Dysthymic disorder: Secondary | ICD-10-CM

## 2018-02-19 DIAGNOSIS — K50112 Crohn's disease of large intestine with intestinal obstruction: Secondary | ICD-10-CM | POA: Diagnosis not present

## 2018-02-19 MED ORDER — FLUOXETINE HCL 20 MG PO CAPS: 40.0000 mg | ORAL_CAPSULE | Freq: Every day | ORAL | 2 refills | Status: DC

## 2018-02-19 NOTE — Telephone Encounter (Signed)
I spoke with Dr. Drue Flirt from [facility] last week regarding Leslie Bolton's path results which were notable for Endometriosis and no histologic evidence of Crohns Disease (see surgical notes and path results in Care Everywhere). She states that she is feeling well today and recovering well from her recent surgical resection. We discussed the path results and will plan on stopping her Entyvio and placing GYN referral for evaluation of her recently diagnosed Endometriosis. She is also scheduled for f/u appt with Dr. Drue Flirt next week. She also established care with PCM, Dr. Letta Median, today. She can f/u with me after she sees Dr. Drue Flirt again, and can discuss a completion colonoscopy sometime in the next year or so, allowing for appropriate recovery and healing. All questions answered.

## 2018-02-19 NOTE — Patient Instructions (Signed)
prozac take 1 cap 57m daily x 1 week then increase to 2 cap (437mtotal) daily

## 2018-02-19 NOTE — Progress Notes (Signed)
Leslie Bolton is a 28 y.o. female  Chief Complaint  Patient presents with  . Establish Care    est care/ fertility consult? (just had partial collectomy 2 wks ago), acne chest and back    HPI: Leslie Bolton is a 28 y.o. female here as a new patient to establish care with our office. She is due for CPE, PAP and will schedule for this. She has had a significant amount of labs recently d/t diagnosis of Crohn's disease with stricture (10/2017) and then obstruction resulting in partial colectomy 2 weeks ago (01/2018).   Pt asks if having had surgery as above will in any way impact her fertility in the future. I explained that her fertility and chances of conception in the future are not impacted by having has abdominal surgery (partial colectomy). She was married in 05/2017 and she and her husband are not planning to TTC in the near future but pt states she just wanted to ask.  Pt was on prozac 74m daily in the past but has been off of this for 680mor so. She did feel it was effective in controlling her depression and anxiety symptoms, and notes minimal side effects. She would like to resume this med as she feels "overwhelmed" after recent Crohn's dx and partial colectomy resulting from obstruction. Sleeping fine. Denies suicidal thoughts. Her husband is very supportive.    Specialists: GI (Dr. ViGerrit Heck   Last CPE, labs: due for CPE and PAP  Last PAP: due Last mammo: n/a Last Dexa: n/a Last colonoscopy: 10/2017   Past Medical History:  Diagnosis Date  . Anxiety   . Crohn's colitis ([facility]  . [place] disease ([place]  . Depression     Past Surgical History:  Procedure Laterality Date  . partial collectomy    . WISDOM TOOTH EXTRACTION     removal of all 4. Age 89  Social History   Socioeconomic History  . Marital status: Married    Spouse name: Not on file  . Number of children: 0  . Years of education: Not on file  . Highest education level: Not on file    Occupational History  . Not on file  Social Needs  . Financial resource strain: Not on file  . Food insecurity:    Worry: Not on file    Inability: Not on file  . Transportation needs:    Medical: Not on file    Non-medical: Not on file  Tobacco Use  . Smoking status: Never Smoker  . Smokeless tobacco: Never Used  . Tobacco comment: tried it once   Substance and Sexual Activity  . Alcohol use: Yes    Comment: once a week  . Drug use: Not Currently    Comment: have used marijuana for stomach issues  . Sexual activity: Not on file  Lifestyle  . Physical activity:    Days per week: Not on file    Minutes per session: Not on file  . Stress: Not on file  Relationships  . Social connections:    Talks on phone: Not on file    Gets together: Not on file    Attends religious service: Not on file    Active member of club or organization: Not on file    Attends meetings of clubs or organizations: Not on file    Relationship status: Not on file  . Intimate partner violence:    Fear of current or ex partner:  Not on file    Emotionally abused: Not on file    Physically abused: Not on file    Forced sexual activity: Not on file  Other Topics Concern  . Not on file  Social History Narrative  . Not on file    Family History  Problem Relation Age of Onset  . Diverticulitis Father        since 2000  . Prostate cancer Maternal Grandfather   . Lung cancer Paternal Grandfather        took vocal cords out  . Colon cancer Neg Hx      Immunization History  Administered Date(s) Administered  . H1N1 12/19/2007  . Influenza Whole 12/19/2007, 12/17/2008  . Influenza,inj,Quad PF,6+ Mos 11/18/2016  . Influenza-Unspecified 10/31/2017  . Td 04/11/2007  . Tdap 10/31/2016    Outpatient Encounter Medications as of 02/19/2018  Medication Sig  . enoxaparin (LOVENOX) 40 MG/0.4ML injection Inject 40 mg into the skin daily.  [name] HYDROcodone-acetaminophen (NORCO/VICODIN) 5-325 MG tablet Take  by mouth.  [name] ibuprofen (ADVIL,MOTRIN) 200 MG tablet Take 400 mg by mouth every 6 (six) hours as needed for headache or mild pain.  [name] ondansetron (ZOFRAN) 4 MG tablet Take 1 tablet (4 mg total) by mouth every 6 (six) hours as needed for nausea.  . pregabalin (LYRICA) 75 MG capsule Take by mouth.  . feeding supplement, ENSURE ENLIVE, (ENSURE ENLIVE) LIQD Take 237 mLs by mouth 3 (three) times daily between meals. (Patient not taking: Reported on 02/19/2018)  . methylPREDNISolone sodium succinate (SOLU-MEDROL) 40 mg/mL injection Inject 0.5 mLs (20 mg total) into the vein every 8 (eight) hours. (Patient not taking: Reported on 02/19/2018)  . morphine 4 MG/ML injection Inject 1 mL (4 mg total) into the vein every 4 (four) hours as needed (breakthrough pain). (Patient not taking: Reported on 02/19/2018)  . Multiple Vitamin (MULTIVITAMIN WITH MINERALS) TABS tablet Take 1 tablet by mouth daily.  . Multiple Vitamin (MULTIVITAMIN WITH MINERALS) TABS tablet Take 1 tablet by mouth daily. (Patient not taking: Reported on 02/19/2018)  . ondansetron (ZOFRAN) 4 MG/2ML SOLN injection Inject 2 mLs (4 mg total) into the vein every 6 (six) hours as needed for nausea. (Patient not taking: Reported on 02/19/2018)  . oxyCODONE (OXY IR/ROXICODONE) 5 MG immediate release tablet Take 1 tablet (5 mg total) by mouth every 4 (four) hours as needed for moderate pain. (Patient not taking: Reported on 02/19/2018)  . zolpidem (AMBIEN) 5 MG tablet Take 1 tablet (5 mg total) by mouth at bedtime. (Patient not taking: Reported on 02/19/2018)  . [DISCONTINUED] dicyclomine (BENTYL) 10 MG capsule Take 1 capsule (10 mg total) by mouth 3 (three) times daily as needed for up to 7 days for spasms.   No facility-administered encounter medications on file as of 02/19/2018.      ROS: Pertinent positives and negatives noted in HPI. Remainder of ROS non-contributory    No Known Allergies  BP 110/68   Pulse 63   Temp 98.1 F (36.7 C) (Oral)   Ht  5' 6"  (1.676 m)   Wt 148 lb 3.2 oz (67.2 kg)   SpO2 96%   BMI 23.92 kg/m   Physical Exam  Constitutional: She is oriented to person, place, and time. She appears well-developed and well-nourished. No distress.  Cardiovascular: Normal rate, regular rhythm and normal heart sounds.  Pulmonary/Chest: Effort normal and breath sounds normal. No respiratory distress.  Musculoskeletal:        General: No edema.  Neurological: She is  alert and oriented to person, place, and time.  Psychiatric: She has a normal mood and affect. Her behavior is normal.     A/P:  1. Dysthymic disorder, some anxiety associated - pt was on prozac 25m in the past and found it to be effective in managing her symptoms Restart: - FLUoxetine (PROZAC) 20 MG capsule; Take 2 capsules (40 mg total) by mouth daily.  Dispense: 60 capsule; Refill: 2 - pt will take 217mdaily x 1 week then increase to 4048maily Discussed plan and reviewed medications with patient, including risks, benefits, and potential side effects. Pt expressed understand. All questions answered.  2. Encounter to establish care with new doctor - pt due for CPE, PAP and will schedule appt for this  3. Crohn's disease of large intestine with intestinal obstruction ([facility]. S/P partial colectomy - cont f/u as scheduled with GI and colorectal surgery

## 2018-02-19 NOTE — Addendum Note (Signed)
Addended by: Ronnald Nian on: 02/19/2018 02:05 PM   Modules accepted: Orders

## 2018-02-20 ENCOUNTER — Other Ambulatory Visit: Payer: Self-pay

## 2018-02-20 ENCOUNTER — Encounter: Payer: Self-pay | Admitting: Family Medicine

## 2018-02-20 DIAGNOSIS — N809 Endometriosis, unspecified: Secondary | ICD-10-CM

## 2018-02-20 NOTE — Progress Notes (Signed)
Referral Sent to [facility]. Referral for Endometriosis.

## 2018-02-21 MED ORDER — TRAZODONE HCL 50 MG PO TABS: 25.0000 mg | ORAL_TABLET | Freq: Every evening | ORAL | 1 refills | Status: DC | PRN

## 2018-03-08 ENCOUNTER — Encounter: Payer: Self-pay | Admitting: Family Medicine

## 2018-03-08 ENCOUNTER — Ambulatory Visit (INDEPENDENT_AMBULATORY_CARE_PROVIDER_SITE_OTHER): Payer: 59 | Admitting: Family Medicine

## 2018-03-08 VITALS — BP 129/86 | HR 64 | Ht 66.0 in | Wt 149.0 lb

## 2018-03-08 DIAGNOSIS — N809 Endometriosis, unspecified: Secondary | ICD-10-CM | POA: Diagnosis not present

## 2018-03-08 MED ORDER — NORETHINDRONE 0.35 MG PO TABS: 1.0000 | ORAL_TABLET | Freq: Every day | ORAL | 11 refills | Status: DC

## 2018-03-08 NOTE — Progress Notes (Signed)
Subjective:    Patient ID: Leslie Bolton, female    DOB: [birth year], 28 y.o.   MRN: [id number]  HPI 28 yo G0 referred to our office for endometriosis involving her colon. She has been followed by Dr. Bryan Lemma of gastroenterology for GI symptoms that were presumed to be Crohn's disease.  She had a colonoscopy which showed a colonic stricture that scope could not transverse.  After a trial of biologic therapy, she was admitted on 02/01/2018 due to her abdominal pain.  She was started on prednisone, which was helpful.  She was discharged on the third, then had elective segmental colectomy on 1/4.  The pathology report showed endometriosis involving the serosa, muscularis propria and submucosa of her sigmoid colon.  Regards to the patient's GYN history, the patient has very painful periods with heavy flow.  Initially, her menses were fairly irregular and she has had pelvic cramps out the month.  She was on birth control at one point, which helps to regulate her cycle.  She stopped them because of weight gain.  She currently has a ParaGard IUD, which was placed approximately 5 years ago.  Her cycles have been fairly normal, but has had increased pain, both with menses and intermenstrual.  At this point, patient and her husband are not planning on becoming pregnant, but she is interested in how endometriosis affects fertility.  I have reviewed the patients past medical, family, and social history.  I have reviewed the patient's medication list and allergies.   Review of Systems  All other systems reviewed and are negative.      Objective:   Physical Exam Constitutional:      Appearance: Normal appearance. She is normal weight.  HENT:     Head: Normocephalic and atraumatic.  Eyes:     Extraocular Movements: Extraocular movements intact.     Pupils: Pupils are equal, round, and reactive to light.  Cardiovascular:     Rate and Rhythm: Normal rate.     Pulses: Normal pulses.  Pulmonary:    Effort: Pulmonary effort is normal.  Abdominal:     General: Abdomen is flat. There is no distension.  Skin:    General: Skin is warm and dry.     Capillary Refill: Capillary refill takes less than 2 seconds.  Neurological:     General: No focal deficit present.     Mental Status: She is alert.  Psychiatric:        Mood and Affect: Mood normal.        Behavior: Behavior normal.        Thought Content: Thought content normal.        Judgment: Judgment normal.       Assessment & Plan:  1. Endometriosis Diagnosis of endometriosis discussed with the patient, including typical areas of abnormal implantation of the endometrial tissue.  Infertility rates ranged from 30-50 percent of patients with endometriosis.  With the IUD currently in place, it would be difficult to predict the patient's infertility at this point.  I did discuss potential for hysteroscopy versus HSG in order to determine the amount of endometrial scarring, which could lead to infertility.  However, I would recommend this after 6 months of trying to become pregnant on their own.  At this point, I discussed options of hormonal treatment and nonhormonal treatment to the patient in order to control the patient's symptoms, including progesterone versus OCP versus symptomatic control with NSAIDs and Oril[place].  At this point, the patient would  like to do a trial of progesterone only pills to see if this is effective.  We will follow-up in 2 to 3 months and plan for an annual exam Pap smear at that time.  30 minutes spent with the patient including reviewing patient's chart, diagnosis, pathology results, and answering questions.

## 2018-03-13 ENCOUNTER — Encounter: Payer: Self-pay | Admitting: Family Medicine

## 2018-03-14 MED ORDER — TRAZODONE HCL 100 MG PO TABS: 100.0000 mg | ORAL_TABLET | Freq: Every evening | ORAL | 0 refills | Status: DC | PRN

## 2018-03-23 ENCOUNTER — Encounter: Payer: Self-pay | Admitting: Family Medicine

## 2018-03-27 MED ORDER — METOCLOPRAMIDE HCL 10 MG PO TABS: 10.0000 mg | ORAL_TABLET | Freq: Three times a day (TID) | ORAL | 0 refills | Status: DC

## 2018-04-05 ENCOUNTER — Other Ambulatory Visit: Payer: Self-pay | Admitting: Family Medicine

## 2018-04-05 ENCOUNTER — Encounter: Payer: Self-pay | Admitting: Family Medicine

## 2018-04-05 DIAGNOSIS — G4700 Insomnia, unspecified: Secondary | ICD-10-CM

## 2018-04-05 MED ORDER — SUVOREXANT 10 MG PO TABS: 1.0000 | ORAL_TABLET | Freq: Every evening | ORAL | 0 refills | Status: DC | PRN

## 2018-04-06 ENCOUNTER — Telehealth: Payer: Self-pay

## 2018-04-06 NOTE — Telephone Encounter (Signed)
PA started waiting for results. Leslie Bolton (Key: [id number])

## 2018-04-11 ENCOUNTER — Encounter: Payer: Self-pay | Admitting: Family Medicine

## 2018-04-11 NOTE — Telephone Encounter (Signed)
PA approved pharmacy notified

## 2018-05-01 ENCOUNTER — Encounter: Payer: Self-pay | Admitting: Family Medicine

## 2018-05-02 MED ORDER — AMITRIPTYLINE HCL 50 MG PO TABS: 50.0000 mg | ORAL_TABLET | Freq: Every day | ORAL | 3 refills | Status: DC

## 2018-05-15 ENCOUNTER — Encounter: Payer: Self-pay | Admitting: Family Medicine

## 2018-05-16 MED ORDER — AMITRIPTYLINE HCL 100 MG PO TABS: 100.0000 mg | ORAL_TABLET | Freq: Every day | ORAL | 1 refills | Status: DC

## 2018-05-24 ENCOUNTER — Encounter: Payer: Self-pay | Admitting: Family Medicine

## 2018-06-19 ENCOUNTER — Encounter: Payer: Self-pay | Admitting: Family Medicine

## 2018-06-21 ENCOUNTER — Telehealth (INDEPENDENT_AMBULATORY_CARE_PROVIDER_SITE_OTHER): Payer: 59 | Admitting: Family Medicine

## 2018-06-21 ENCOUNTER — Encounter: Payer: Self-pay | Admitting: Family Medicine

## 2018-06-21 VITALS — Ht 66.0 in

## 2018-06-21 DIAGNOSIS — G4700 Insomnia, unspecified: Secondary | ICD-10-CM | POA: Diagnosis not present

## 2018-06-21 DIAGNOSIS — F419 Anxiety disorder, unspecified: Secondary | ICD-10-CM | POA: Diagnosis not present

## 2018-06-21 DIAGNOSIS — F341 Dysthymic disorder: Secondary | ICD-10-CM

## 2018-06-21 MED ORDER — BUSPIRONE HCL 7.5 MG PO TABS: 7.5000 mg | ORAL_TABLET | Freq: Two times a day (BID) | ORAL | 3 refills | Status: DC

## 2018-06-21 MED ORDER — SERTRALINE HCL 50 MG PO TABS: 50.0000 mg | ORAL_TABLET | Freq: Every day | ORAL | 3 refills | Status: DC

## 2018-06-21 NOTE — Progress Notes (Signed)
Virtual Visit via Video Note  I connected with Leslie Bolton on 06/21/18 at  9:30 AM EDT by a video enabled telemedicine application and verified that I am speaking with the correct person using two identifiers. Location patient: home Location provider: work or home office Persons participating in the virtual visit: patient, provider  I discussed the limitations of evaluation and management by telemedicine and the availability of in person appointments. The patient expressed understanding and agreed to proceed.  Chief Complaint  Patient presents with  . Insomnia    insomnia consult/pt having issues with medication (mychart message)     HPI: Leslie Bolton is a 28 y.o. female to f/u on issues with insomnia (trouble falling and staying asleep) and anxiety, depression. These are chronic (> 20yr) issues for pt. 10 years ago pt was on prozac daily, ambien qHS, and xanax PRN. She stopped taking all of these for about 1 year and then went back on them with less efficacy and improvement in symptoms. Since establishing care with me, pt has tried trazodone, belsomra without significant improvement and/or with side effects. Pt was then started on amitriptyline 529mqHS and in time increased to 10081mHS. This helped significantly with anxiety, depression, and sleep but pt notes side effect of constipation. She tried miralax and stool softener with minimal improvement. She also notes increased appetite with amitriptyline. She has stopped the med d/t these side effects. Pt feels her underlying issue with having trouble sleeping is her anxiety and inability to shut her mind off or "at least calm it down". She feels with her anxiety better controlled, both during the day and at night, she would then sleep better.   Past Medical History:  Diagnosis Date  . Anxiety   . Crohn's colitis ([facility] . [place]'s disease ([facility] . Depression     Past Surgical History:  Procedure Laterality Date  . partial  collectomy    . WISDOM TOOTH EXTRACTION     removal of all 4. Age [age] Family History  Problem Relation Age of Onset  . Diverticulitis Father        since 2000  . Prostate cancer Maternal Grandfather   . Lung cancer Paternal Grandfather        took vocal cords out  . Colon cancer Neg Hx     Social History   Tobacco Use  . Smoking status: Never Smoker  . Smokeless tobacco: Never Used  . Tobacco comment: tried it once   Substance Use Topics  . Alcohol use: Yes    Comment: once a week  . Drug use: Not Currently    Comment: have used marijuana for stomach issues     Current Outpatient Medications:  .  clindamycin (CLEOCIN T) 1 % SWAB, APP TO FACE BID FOR ACNE, Disp: , Rfl:  .  busPIRone (BUSPAR) 7.5 MG tablet, Take 1 tablet (7.5 mg total) by mouth 2 (two) times daily., Disp: 180 tablet, Rfl: 3 .  clindamycin (CLEOCIN T) 1 % external solution, Apply topically 2 (two) times daily., Disp: , Rfl:  .  metoCLOPramide (REGLAN) 10 MG tablet, Take 1 tablet (10 mg total) by mouth 3 (three) times daily before meals for 7 days., Disp: 21 tablet, Rfl: 0 .  Multiple Vitamin (MULTIVITAMIN WITH MINERALS) TABS tablet, Take 1 tablet by mouth daily., Disp: , Rfl:  .  norethindrone (ORTHO MICRONOR) 0.35 MG tablet, Take 1 tablet (0.35 mg total) by mouth daily. (Patient  not taking: Reported on 06/21/2018), Disp: 1 Package, Rfl: 11 .  ondansetron (ZOFRAN) 4 MG tablet, Take 1 tablet (4 mg total) by mouth every 6 (six) hours as needed for nausea. (Patient not taking: Reported on 06/21/2018), Disp: 20 tablet, Rfl: 0 .  pregabalin (LYRICA) 75 MG capsule, Take by mouth., Disp: , Rfl:  .  sertraline (ZOLOFT) 50 MG tablet, Take 1 tablet (50 mg total) by mouth daily., Disp: 90 tablet, Rfl: 3  No Known Allergies    ROS: See pertinent positives and negatives per HPI.   EXAM:  VITALS per patient if applicable:  GENERAL: alert, oriented, appears well and in no acute distress  HEENT: atraumatic,  conjunctiva clear, no obvious abnormalities on inspection of external nose and ears  NECK: normal movements of the head and neck  LUNGS: on inspection no signs of respiratory distress, breathing rate appears normal, no obvious gross SOB, gasping or wheezing, no conversational dyspnea  CV: no obvious cyanosis  MS: moves all visible extremities without noticeable abnormality  PSYCH/NEURO: pleasant and cooperative, no obvious depression or anxiety, speech and thought processing grossly intact   ASSESSMENT AND PLAN:  1. Anxiety 2. Dysthymic disorder, some anxiety associated 3. Insomnia, unspecified type - pt stopped amitriptyline 1 week ago Rx: - sertraline (ZOLOFT) 50 MG tablet; Take 1 tablet (50 mg total) by mouth daily.  Dispense: 90 tablet; Refill: 3 - busPIRone (BUSPAR) 7.5 MG tablet; Take 1 tablet (7.5 mg total) by mouth 2 (two) times daily.  Dispense: 180 tablet; Refill: 3 - f/u in 2-3 wks or sooner PRN    I discussed the assessment and treatment plan with the patient. The patient was provided an opportunity to ask questions and all were answered. The patient agreed with the plan and demonstrated an understanding of the instructions.   The patient was advised to call back or seek an in-person evaluation if the symptoms worsen or if the condition fails to improve as anticipated.   Letta Median, DO

## 2018-06-28 ENCOUNTER — Ambulatory Visit: Payer: 59 | Admitting: Family Medicine

## 2018-07-02 ENCOUNTER — Encounter: Payer: Self-pay | Admitting: Family Medicine

## 2018-07-02 DIAGNOSIS — F419 Anxiety disorder, unspecified: Secondary | ICD-10-CM

## 2018-07-20 ENCOUNTER — Encounter: Payer: Self-pay | Admitting: Family Medicine

## 2018-07-20 ENCOUNTER — Ambulatory Visit (INDEPENDENT_AMBULATORY_CARE_PROVIDER_SITE_OTHER): Payer: 59 | Admitting: Family Medicine

## 2018-07-20 ENCOUNTER — Other Ambulatory Visit: Payer: Self-pay

## 2018-07-20 VITALS — BP 120/72 | HR 55 | Ht 66.0 in | Wt 156.0 lb

## 2018-07-20 DIAGNOSIS — N809 Endometriosis, unspecified: Secondary | ICD-10-CM

## 2018-07-20 DIAGNOSIS — Z01419 Encounter for gynecological examination (general) (routine) without abnormal findings: Secondary | ICD-10-CM

## 2018-07-20 DIAGNOSIS — G8929 Other chronic pain: Secondary | ICD-10-CM

## 2018-07-20 DIAGNOSIS — M9903 Segmental and somatic dysfunction of lumbar region: Secondary | ICD-10-CM | POA: Diagnosis not present

## 2018-07-20 DIAGNOSIS — M545 Low back pain: Secondary | ICD-10-CM | POA: Diagnosis not present

## 2018-07-20 DIAGNOSIS — F419 Anxiety disorder, unspecified: Secondary | ICD-10-CM

## 2018-07-20 NOTE — Progress Notes (Signed)
GYNECOLOGY ANNUAL PREVENTATIVE CARE ENCOUNTER NOTE  Subjective:   Leslie Bolton is a 28 y.o. G0P0000 female here for a routine annual gynecologic exam.  Current complaints: continued heavy and painful cycles. Has paragard IUD. At our last appointment, I placed her on micronor to see if that would help with her cycles. She had a lot of nausea and difficulty with mood during the two or so months that she tried the POPs. She stopped them, then restarted after month's break. Had more nausea. Now off the mirconor. Also trying other medication. She also complains of back pain in her right lower back, which is fairly consistent over the past several months. No radiation to pain. Pain mostly dull ache. No movements make it worse. Ibuprofen help. Denies abnormal vaginal bleeding, discharge, pelvic pain, problems with intercourse or other gynecologic concerns.    Gynecologic History Patient's last menstrual period was 07/09/2018 (approximate). Patient is sexually active  Contraception: IUD Last Pap: 2017. Results were: normal Last mammogram: n/a  Obstetric History OB History  Gravida Para Term Preterm AB Living  0 0 0 0 0 0  SAB TAB Ectopic Multiple Live Births  0 0 0 0 0    Past Medical History:  Diagnosis Date  . Anxiety   . Crohn's colitis ([place])   . Crohn's disease ([place])   . Depression     Past Surgical History:  Procedure Laterality Date  . partial collectomy    . WISDOM TOOTH EXTRACTION     removal of all 4. Age 76    Current Outpatient Medications on File Prior to Visit  Medication Sig Dispense Refill  . clindamycin (CLEOCIN T) 1 % external solution Apply topically 2 (two) times daily.    . clindamycin (CLEOCIN T) 1 % SWAB APP TO FACE BID FOR ACNE    . Multiple Vitamin (MULTIVITAMIN WITH MINERALS) TABS tablet Take 1 tablet by mouth daily.    . busPIRone (BUSPAR) 7.5 MG tablet Take 1 tablet (7.5 mg total) by mouth 2 (two) times daily. (Patient not taking: Reported on  07/20/2018) 180 tablet 3  . metoCLOPramide (REGLAN) 10 MG tablet Take 1 tablet (10 mg total) by mouth 3 (three) times daily before meals for 7 days. 21 tablet 0  . norethindrone (ORTHO MICRONOR) 0.35 MG tablet Take 1 tablet (0.35 mg total) by mouth daily. (Patient not taking: Reported on 06/21/2018) 1 Package 11  . ondansetron (ZOFRAN) 4 MG tablet Take 1 tablet (4 mg total) by mouth every 6 (six) hours as needed for nausea. (Patient not taking: Reported on 06/21/2018) 20 tablet 0  . pregabalin (LYRICA) 75 MG capsule Take by mouth.    . sertraline (ZOLOFT) 50 MG tablet Take 1 tablet (50 mg total) by mouth daily. (Patient not taking: Reported on 07/20/2018) 90 tablet 3   No current facility-administered medications on file prior to visit.     No Known Allergies  Social History   Socioeconomic History  . Marital status: Married    Spouse name: Not on file  . Number of children: 0  . Years of education: Not on file  . Highest education level: Not on file  Occupational History  . Not on file  Social Needs  . Financial resource strain: Not on file  . Food insecurity    Worry: Not on file    Inability: Not on file  . Transportation needs    Medical: Not on file    Non-medical: Not on file  Tobacco  Use  . Smoking status: Never Smoker  . Smokeless tobacco: Never Used  . Tobacco comment: tried it once   Substance and Sexual Activity  . Alcohol use: Yes    Comment: once a week  . Drug use: Not Currently    Comment: have used marijuana for stomach issues  . Sexual activity: Yes    Birth control/protection: I.U.D.  Lifestyle  . Physical activity    Days per week: Not on file    Minutes per session: Not on file  . Stress: Not on file  Relationships  . Social Herbalist on phone: Not on file    Gets together: Not on file    Attends religious service: Not on file    Active member of club or organization: Not on file    Attends meetings of clubs or organizations: Not on file     Relationship status: Not on file  . Intimate partner violence    Fear of current or ex partner: Not on file    Emotionally abused: Not on file    Physically abused: Not on file    Forced sexual activity: Not on file  Other Topics Concern  . Not on file  Social History Narrative  . Not on file    Family History  Problem Relation Age of Onset  . Diverticulitis Father        since 2000  . Prostate cancer Maternal Grandfather   . Lung cancer Paternal Grandfather        took vocal cords out  . Colon cancer Neg Hx     The following portions of the patient's history were reviewed and updated as appropriate: allergies, current medications, past family history, past medical history, past social history, past surgical history and problem list.  Review of Systems Pertinent items are noted in HPI.   Objective:  BP 120/72   Pulse (!) 55   Ht 5' 6"  (1.676 m)   Wt 156 lb (70.8 kg)   LMP 07/09/2018 (Approximate)   BMI 25.18 kg/m  Wt Readings from Last 3 Encounters:  07/20/18 156 lb (70.8 kg)  03/08/18 149 lb (67.6 kg)  02/19/18 148 lb 3.2 oz (67.2 kg)     CONSTITUTIONAL: Well-developed, well-nourished female in no acute distress.  HENT:  Normocephalic, atraumatic, External right and left ear normal. Oropharynx is clear and moist EYES: Conjunctivae and EOM are normal. Pupils are equal, round, and reactive to light. No scleral icterus.  NECK: Normal range of motion, supple, no masses.  Normal thyroid.   CARDIOVASCULAR: Normal heart rate noted, regular rhythm RESPIRATORY: Clear to auscultation bilaterally. Effort and breath sounds normal, no problems with respiration noted. BREASTS: Symmetric in size. No masses, skin changes, nipple drainage, or lymphadenopathy. ABDOMEN: Soft, normal bowel sounds, no distention noted.  No tenderness, rebound or guarding.  PELVIC: Normal appearing external genitalia; normal appearing vaginal mucosa and cervix.  Extropion seen on cervix. No abnormal  discharge noted.  Normal uterine size, no other palpable masses, no uterine or adnexal tenderness. IUD strings visualized. MUSCULOSKELETAL: Normal range of motion. Tenderness to lumbar spine, particularly right lower lumbar paraspinals.  No cyanosis, clubbing, or edema.  2+ distal pulses.  OSE: L1 ESRL, L5 ESRR, L/L, ant innom. SKIN: Skin is warm and dry. No rash noted. Not diaphoretic. No erythema. No pallor. NEUROLOGIC: Alert and oriented to person, place, and time. Normal reflexes, muscle tone coordination. No cranial nerve deficit noted. PSYCHIATRIC: Normal mood and affect. Normal behavior.  Normal judgment and thought content.  Assessment:  Annual gynecologic examination with pap smear   Plan:  1. Well Woman Exam Will follow up results of pap smear and manage accordingly. - Cytology - PAP  2. Endometriosis Discussed treatment options - different form of progesterone, OCPs. Fertility plans: planning on pregnancy next year.  3. Anxiety  4. Chronic right-sided low back pain without sciatica 5. Somatic dysfunction of spine, lumbar OMT done after patient permission. HVLA technique utilized. 3 areas treated with improvement of tissue texture and joint mobility. Patient tolerated procedure well.   Routine preventative health maintenance measures emphasized. Please refer to After Visit Summary for other counseling recommendations.    Loma Boston, [place] for [employer]

## 2018-07-20 NOTE — Progress Notes (Signed)
Here for annual and follow up on last visit

## 2018-07-23 LAB — CYTOLOGY - PAP: Diagnosis: NEGATIVE

## 2018-08-14 NOTE — Telephone Encounter (Signed)
Called and spoke with patient-patient is agreeable to plan of care -patient has a virtual/telephone appt scheduled on 08/21/2018 at 9:00 am; Patient verbalized understanding of information/instructions;  Patient was advised to call back if questions/concerns arise;

## 2018-08-20 ENCOUNTER — Other Ambulatory Visit: Payer: Self-pay | Admitting: Family Medicine

## 2018-08-21 ENCOUNTER — Telehealth (INDEPENDENT_AMBULATORY_CARE_PROVIDER_SITE_OTHER): Payer: 59 | Admitting: Gastroenterology

## 2018-08-21 ENCOUNTER — Other Ambulatory Visit: Payer: Self-pay

## 2018-08-21 ENCOUNTER — Encounter: Payer: Self-pay | Admitting: Gastroenterology

## 2018-08-21 VITALS — Ht 66.0 in | Wt 156.0 lb

## 2018-08-21 DIAGNOSIS — K921 Melena: Secondary | ICD-10-CM

## 2018-08-21 DIAGNOSIS — R1030 Lower abdominal pain, unspecified: Secondary | ICD-10-CM | POA: Diagnosis not present

## 2018-08-21 DIAGNOSIS — N809 Endometriosis, unspecified: Secondary | ICD-10-CM

## 2018-08-21 NOTE — Progress Notes (Signed)
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  Normal ESR/CRP, and no response to a brief trial of Entyvio.  She was hospitalized in 01/2018, and no response to Solu-Medrol, with clinical suspicion decreasing for Crohn's disease at that time.  She was transferred to [facility] and underwent surgical resection by Dr. Drue Flirt in 01/2018.  Path results notable for Endometriosis and no histologic evidence of Crohn's Disease.  -01/2018: Segmental resection of the sigmoid/rectosigmoid -Gastrografin enema study (01/2018): Stricture of the sigmoid colon opened up after several attempts appears to be related to spasm possibly secondary to Crohn's disease, contrast otherwise filled the entire colon to the cecum without additional stricture, mass, extravasation -CT (01/2018): Segmental wall thickening in the distal sigmoid colon with minimal pericolonic fat stranding -CT (12/2017): Wall thickening in the sigmoid colon, improved from prior - CT (10/2017): Long segment of mucosal thickening in the distal sigmoid colon and rectum, mild pericolonic inflammatory changes in the surrounding mesentery without abscess.  Normal-appearing small bowel. -Colonoscopy (10/2017, Dr. Bryan Lemma): Benign-appearing, intrinsic, severe stenosis measuring 5 mm (inner diameter) in the rectosigmoid colon, and was non-traversable.  Located 20 cm from the anal verge.  Overlying mucosa was otherwise normal-appearing.  Biopsies benign.  Tattoo placed. -10/2017: Normal CBC, CMP, ESR, CRP -CT (2010): Terminal ileal thickening  Follows with Dr. Nehemiah Settle at [facility] (Endometriosis) and Dr. Letta Median at [facility] (insomnia, anxiety,  depression)  HPI:    Due to current restrictions/limitations of in-office visits due to the COVID-19 pandemic, this scheduled clinical appointment was converted to a telehealth virtual consultation using Doximity.  -Time of medical discussion: 25 minutes -The patient did consent to this virtual visit and is aware of possible charges through their insurance for this visit.  -Names of all parties present: Leslie Bolton (patient), Gerrit Heck, DO, [facility] (physician) -Patient location: Home -Physician location: Office  EMMALENE Bolton is a 28 y.o. female with a history of Endometriosis s/p segmental sigmoid resection, anxiety/depression, presenting to the Gastroenterology Clinic for evaluation of lower abdominal pain.  Endorses lower abdominal cramping and lower right back pain since 06/2018, with increasing frequency.  Symptoms present most days/week, with variable intensity/severity.  Does have some improvement with NSAIDs. She has since seen hematochezia x2, with last being last week. Occasional sharp lower abd pain with sneeze/cough.  Has been on multiple medications for anxiety/depression as outlined below, but unclear if GI symptoms were improved while on any of these medications.  Does not tend to be related to p.o. intake. Trialed magnesium without any improvement. Some improvement with heating pad.   For her anxiety/depression, has recently tried trazodone, Belsomra without significant improvements and c/b ADR.  Started on amitriptyline with improvement in anxiety/depression, but does note ADR of constipation.  Has tried MiraLAX and stool softener with some improvement.  She has since stopped the medication due to side effects.  Recently started on Zoloft and BuSpar last month. She has since stopped all medications d/t nausea.   Past medical history, past surgical history, social history, family history, medications, and allergies reviewed in the chart and with patient.    Past Medical  History:  Diagnosis Date  . Anxiety   .  Endometriosis   .    [name] Depression      Past Surgical History:  Procedure Laterality Date  . partial collectomy    . WISDOM TOOTH EXTRACTION     removal of all 4. Age 2   Family History  Problem Relation Age of Onset  . Diverticulitis Father        since 2000  . Prostate cancer Maternal Grandfather   . Lung cancer Paternal Grandfather        took vocal cords out  . Colon cancer Neg Hx    Social History   Tobacco Use  . Smoking status: Never Smoker  . Smokeless tobacco: Never Used  . Tobacco comment: tried it once   Substance Use Topics  . Alcohol use: Yes    Comment: once a week  . Drug use: Not Currently    Comment: have used marijuana for stomach issues   Current Outpatient Medications  Medication Sig Dispense Refill  . clindamycin (CLEOCIN T) 1 % external solution Apply topically 2 (two) times daily.    . clindamycin (CLEOCIN T) 1 % SWAB APP TO FACE BID FOR ACNE    . metoCLOPramide (REGLAN) 10 MG tablet Take 1 tablet (10 mg total) by mouth 3 (three) times daily before meals for 7 days. 21 tablet 0  . Multiple Vitamin (MULTIVITAMIN WITH MINERALS) TABS tablet Take 1 tablet by mouth daily.    . pregabalin (LYRICA) 75 MG capsule Take by mouth.     No current facility-administered medications for this visit.    No Known Allergies   Review of Systems: All systems reviewed and negative except where noted in HPI.     Physical Exam:    Complete physical exam not completed due to the nature of this telehealth communication.   Gen: Awake, alert, and oriented, and well communicative. HEENT: EOMI, non-icteric sclera, NCAT, MMM Neck: Normal movement of head and neck Pulm: No labored breathing, speaking in full sentences without conversational dyspnea Derm: No apparent lesions or bruising in visible field MS: Moves all visible extremities without noticeable abnormality Psych: Pleasant, cooperative, normal speech, thought  processing seemingly intact   ASSESSMENT AND PLAN;   1) Hematochezia 2) Lower abdominal cramping/pain Discussed the DDX for symptoms to include IBS, ulcer, postsurgical, or perhaps Endometriosis.  Discussed options for evaluation and treatment, to include colonoscopy, labs, imaging,  dietary modifications, medications and she opted for the following:  - Check CBC, BMP, ESR, CRP -Of note, ESR/CRP were normal despite active issues in 10/2017 - She'd like to hold off on colonoscopy for now.  Did discuss that should ultimately plan for completion colonoscopy at some point in time - Will email low FODMAP diet to her - She'd like to hold off on a trial of Symax - Continued observation.  If return of hematochezia, will likely proceed with colonoscopy at that time  3) History of Endometriosis: Hx of Endometriosis presenting as sigmoid stricture, s/p segmental sigmoid resection primary anastomosis in 01/2018.  Follows with GYN.  Discussed the possibility that her above symptoms are due to underlying Endometriosis.  4) Anxiety/depression: -Has since stopped all medications due to ADR profile.  Unclear if abdominal symptoms were improved while taking these  RTC in 3-6 months or sooner as needed   Lavena Bullion, DO, FACG  08/21/2018, 8:24 AM   Kamyiah Colantonio, Garvin Fila, DO

## 2018-08-21 NOTE — Patient Instructions (Signed)
If you are age 30 or older, your body mass index should be between 23-30. Your Body mass index is 25.18 kg/m. If this is out of the aforementioned range listed, please consider follow up with your Primary Care Provider.  If you are age 39 or younger, your body mass index should be between 19-25. Your Body mass index is 25.18 kg/m. If this is out of the aformentioned range listed, please consider follow up with your Primary Care Provider.   To help prevent the possible spread of infection to our patients, communities, and staff; we will be implementing the following measures:  As of now we are not allowing any visitors/family members to accompany you to any upcoming appointments with [facility]. If you have any concerns about this please contact our office to discuss prior to the appointment.   Your provider has requested that you go to the basement level for lab work at our [place] location ([address] [place] [address]) . Press "B" on the elevator. The lab is located at the first door on the left as you exit the elevator. You may go at whatever time is convienent for you. The current hours of operations are Monday- Friday 7:30am-4:30pm.  I have mailed a copy of Low FODMAP diet to you.  Please call our office at [phone] to set up your 3-6 month follow up visit.  It was a pleasure to see you today!  Vito Cirigliano, D.O.

## 2018-09-13 ENCOUNTER — Encounter: Payer: Self-pay | Admitting: Family Medicine

## 2018-09-13 DIAGNOSIS — R0789 Other chest pain: Secondary | ICD-10-CM

## 2018-09-13 DIAGNOSIS — R5383 Other fatigue: Secondary | ICD-10-CM

## 2018-09-13 DIAGNOSIS — J029 Acute pharyngitis, unspecified: Secondary | ICD-10-CM

## 2018-09-14 ENCOUNTER — Other Ambulatory Visit: Payer: Self-pay

## 2018-09-14 DIAGNOSIS — Z20822 Contact with and (suspected) exposure to covid-19: Secondary | ICD-10-CM

## 2018-09-16 LAB — NOVEL CORONAVIRUS, NAA: SARS-CoV-2, NAA: NOT DETECTED

## 2018-11-18 ENCOUNTER — Encounter: Payer: Self-pay | Admitting: Family Medicine

## 2018-12-07 ENCOUNTER — Encounter: Payer: Self-pay | Admitting: Gastroenterology

## 2018-12-07 ENCOUNTER — Ambulatory Visit (INDEPENDENT_AMBULATORY_CARE_PROVIDER_SITE_OTHER): Payer: 59 | Admitting: Gastroenterology

## 2018-12-07 VITALS — BP 94/70 | HR 60 | Temp 98.5°F | Ht 66.0 in | Wt 162.8 lb

## 2018-12-07 DIAGNOSIS — N809 Endometriosis, unspecified: Secondary | ICD-10-CM

## 2018-12-07 DIAGNOSIS — K921 Melena: Secondary | ICD-10-CM

## 2018-12-07 DIAGNOSIS — R1084 Generalized abdominal pain: Secondary | ICD-10-CM | POA: Diagnosis not present

## 2018-12-07 DIAGNOSIS — R194 Change in bowel habit: Secondary | ICD-10-CM | POA: Diagnosis not present

## 2018-12-07 DIAGNOSIS — Z1159 Encounter for screening for other viral diseases: Secondary | ICD-10-CM

## 2018-12-07 DIAGNOSIS — R112 Nausea with vomiting, unspecified: Secondary | ICD-10-CM

## 2018-12-07 MED ORDER — HYOSCYAMINE SULFATE SL 0.125 MG SL SUBL: 0.1250 mg | SUBLINGUAL_TABLET | SUBLINGUAL | 2 refills | Status: DC | PRN

## 2018-12-07 MED ORDER — NA SULFATE-K SULFATE-MG SULF 17.5-3.13-1.6 GM/177ML PO SOLN: 1.0000 | ORAL | 0 refills | Status: AC

## 2018-12-07 NOTE — Progress Notes (Signed)
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  Normal ESR/CRP, and no response to a brief trial of Entyvio.  She was hospitalized in 01/2018, and no response to Solu-Medrol, with clinical suspicion decreasing for Crohn's disease at that time.  She was transferred to [facility] and underwent surgical resection by Dr. Drue Flirt in 01/2018.  Path results notable for Endometriosis and no histologic evidence of Crohn's Disease.  -01/2018: Segmental resection of the sigmoid/rectosigmoid -Gastrografin enema study (01/2018): Stricture of the sigmoid colon opened up after several attempts appears to be related to spasm possibly secondary to Crohn's disease, contrast otherwise filled the entire colon to the cecum without additional stricture, mass, extravasation -CT (01/2018): Segmental wall thickening in the distal sigmoid colon with minimal pericolonic fat stranding -CT (12/2017): Wall thickening in the sigmoid colon, improved from prior - CT (10/2017): Long segment of mucosal thickening in the distal sigmoid colon and rectum, mild pericolonic inflammatory changes in the surrounding mesentery without abscess. Normal-appearing small bowel. -Colonoscopy (10/2017, Dr. Bryan Lemma): Benign-appearing, intrinsic, severe stenosis measuring 5 mm (inner diameter) in the rectosigmoid colon, and was non-traversable.  Located 20 cm from the anal verge.  Overlying mucosa was otherwise normal-appearing.  Biopsies benign.  Tattoo placed. -10/2017: Normal CBC, CMP, ESR, CRP -CT (2010): Terminal ileal thickening  Follows with Dr. Nehemiah Settle at [facility] (Endometriosis) and Dr. Letta Median at [facility] (insomnia, anxiety, depression)  HPI:    Patient is a 28 y.o. female presenting to the  Gastroenterology Clinic for follow-up.  Was last seen by me in 08/2018 (telemedicine appointment) with ongoing lower abdominal pain/cramping at that time.  Did have 2 episodes of hematochezia in 06/2018.  Ordered CBC, BMP, ESR, CRP (not completed).  Recommended low FODMAP diet.  No new imaging for review today.  Today, she states she continues to have intermittent, sharp and cramping abdominal pain.  Worse in the lower abdomen, but can occur throughout.  Did have mucus-like stools with some of these episodes.  A few episodes of hematochezia through the summer, but has not recurred in the last few months.  Intermittent nausea, with only single episode of nonbloody emesis.  Feels that her abdominal pain tends to mimic menstrual cycle, but can also occur independently.  Weight stable.  For her anxiety/depression, has trialed multiple medications to include trazodone, Belsomra, amitriptyline (ADR constipation, treated with MiraLAX and stool softener but then stopped amitriptyline), Zoloft, BuSpar.  No longer taking any of these.  Currently taking Lunesta and Rozerem for insomnia.  Review of systems:     No chest pain, no SOB, no fevers, no urinary sx   Past Medical History:  Diagnosis Date  . Anxiety   . Depression   . Endometriosis     Patient's surgical history, family medical history, social history, medications and allergies were all reviewed in Epic    Current Outpatient Medications  Medication Sig Dispense Refill  . clindamycin (CLEOCIN T) 1 % external solution Apply topically 2 (two) times daily.    . clindamycin (CLEOCIN T) 1 % SWAB APPLY TO FACE TWICE DAILY FOR ACNE 60 each 5  . eszopiclone (LUNESTA) 2 MG TABS tablet Take 1-2 mg by mouth at bedtime as needed.    . Multiple Vitamin (MULTIVITAMIN WITH MINERALS) TABS tablet Take  1 tablet by mouth daily.    . ondansetron (ZOFRAN) 4 MG tablet Take 4 mg by mouth daily as needed.     . ramelteon (ROZEREM) 8 MG tablet Take 8 mg by mouth at  bedtime.     No current facility-administered medications for this visit.     Physical Exam:     BP 94/70   Pulse 60   Temp 98.5 F (36.9 C)   Ht _0  (1.676 m)   Wt 162 lb 12.8 oz (73.8 kg)   BMI 26.28 kg/m   GENERAL:  Pleasant female in NAD PSYCH: : Cooperative, normal affect EENT:  conjunctiva pink, mucous membranes moist, neck supple without masses CARDIAC:  RRR, no murmur heard, no peripheral edema PULM: Normal respiratory effort, lungs CTA bilaterally, no wheezing ABDOMEN: Mild, generalized TTP throughout the abdomen, without rebound or guarding.  No peritoneal signs.  Otherwise nondistended, soft. No obvious masses, no hepatomegaly,  normal bowel sounds SKIN:  turgor, no lesions seen Musculoskeletal:  Normal muscle tone, normal strength NEURO: Alert and oriented x 3, no focal neurologic deficits   IMPRESSION and PLAN:     1) Generalized abdominal pain 2) Change in bowel habits 3) Mucus-like stools 4) History of endometriosis 5) Nausea without emesis 6) Hematochezia  Discussed her clinical presentation at length today.  Certainly has multiple potential etiologies, to include history of Endometriosis, possible superimposed IBS, less likely concomitant IBD.  Discussed diagnostic work-up at length and will proceed as below:  -Colonoscopy/ileoscopy to evaluate previous surgical site along with evaluate for additional mucosal/luminal pathology.  Plan for random and directed biopsies -EGD to assess for nausea and small bowel pathology with random and directed biopsies -Schedule follow-up with Dr. Nehemiah Settle in the GYN clinic to discuss further diagnostic work-up of possible ongoing Endometriosis.  Will assist in scheduling expedited appointment.  If he feels that endoscopic evaluation should wait, can reschedule pending that work-up -CBC, ESR/CRP as previously ordered -Trial Levsin 0.125 mg prn every 4-6 hours  7) Anxiety/Depression 8) Insomnia -Follows with Dr. Letta Median.  No longer taking any antianxiety/depression medications -Have previously discussed overlap between anxiety and GI symptomatology   The indications, risks, and benefits of EGD and colonoscopy were explained to the patient in detail. Risks include but are not limited to bleeding, perforation, adverse reaction to medications, and cardiopulmonary compromise. Sequelae include but are not limited to the possibility of surgery, hositalization, and mortality. The patient verbalized understanding and wished to proceed. All questions answered, referred to scheduler and bowel prep ordered. Further recommendations pending results of the exam.           Lavena Bullion ,DO, FACG 12/07/2018, 10:25 AM

## 2018-12-07 NOTE — Patient Instructions (Addendum)
You have been scheduled for an endoscopy and colonoscopy. Please follow the written instructions given to you at your visit today. Please pick up your prep supplies at the pharmacy within the next 1-3 days. If you use inhalers (even only as needed), please bring them with you on the day of your procedure.  We have sent the following medications to your pharmacy for you to pick up at your convenience: Levsin 0.125 mg every 4-6 hours as needed   We have scheduled you a follow up with Dr. Nehemiah Settle on 12-17-2018 at 8:15 am.

## 2018-12-17 ENCOUNTER — Encounter: Payer: Self-pay | Admitting: Family Medicine

## 2018-12-17 ENCOUNTER — Ambulatory Visit (INDEPENDENT_AMBULATORY_CARE_PROVIDER_SITE_OTHER): Payer: 59 | Admitting: Family Medicine

## 2018-12-17 ENCOUNTER — Other Ambulatory Visit: Payer: Self-pay

## 2018-12-17 VITALS — BP 125/75 | HR 73 | Ht 66.0 in | Wt 164.1 lb

## 2018-12-17 DIAGNOSIS — K921 Melena: Secondary | ICD-10-CM

## 2018-12-17 DIAGNOSIS — N809 Endometriosis, unspecified: Secondary | ICD-10-CM | POA: Diagnosis not present

## 2018-12-17 DIAGNOSIS — Z975 Presence of (intrauterine) contraceptive device: Secondary | ICD-10-CM | POA: Diagnosis not present

## 2018-12-17 NOTE — Progress Notes (Signed)
   Subjective:    Patient ID: Leslie Bolton, female    DOB: Jul 28, 1990, 28 y.o.   MRN: [id number]  HPI Kyndra is seen for follow up for endometriosis.  She was diagnosed with endometriosis on 01/2018 after partial sigmoid colectomy secondary to a stricture caused by colonic endometriosis.  She currently has an copper IUD in place.  She reports irregular menses and vaginal bleeding.  Additionally, she is noted some increase in abdominal pain, particularly around the time of her menses, as well as prior to her menses and afterwards.  She has about a week or so of feeling well.  She is not currently tracking her symptoms or bleeding.  In addition to her abdominal pain, she has noted some blood in her stools as well as mucus containing stools.  In addition to her copper IUD, we did a trial of Micronor to suppress endometrium.  However, she had a lot of side effects with nausea, disruption of her normal mood.  She attempted medication for a couple of months before stopping.  She and her husband are considering trying to become pregnant beginning of the year.   Review of Systems     Objective:   Physical Exam Constitutional:      Appearance: Normal appearance.  Abdominal:     General: Abdomen is flat.     Palpations: Abdomen is soft.  Neurological:     General: No focal deficit present.     Mental Status: She is alert.  Psychiatric:        Mood and Affect: Mood normal.        Behavior: Behavior normal.        Thought Content: Thought content normal.        Judgment: Judgment normal.        Assessment & Plan:  1. Endometriosis 2. Blood in stool 3. IUD contraception Discussed treatments for endometriosis: Progestins, combined estrogen/progesterone birth control pills, estrogen antagonists. All of these would be in contra to her goals of trying to become pregnant. The copper IUD is likely increasing any dysmenorrhea that she is having and removal would improve this. However, the IUD  shouldn't affect the colonic endometriosis. I also recommend that she start tracking her cycle, along with breakthrough bleeding and pain that she has.  At this point, with the blood in her stools, it appears that she may have some residual colonic endometriosis.  I see no barriers to proceeding with endoscopy to evaluate other sources of her GI bleeding. She will return after the colonoscopy to discuss the results. She realizes that her fertility plans may need to be put on hold in order to get the endometriosis under control. Will plan on IUD removal at that time.

## 2018-12-26 ENCOUNTER — Encounter: Payer: Self-pay | Admitting: Gastroenterology

## 2018-12-31 ENCOUNTER — Ambulatory Visit (INDEPENDENT_AMBULATORY_CARE_PROVIDER_SITE_OTHER): Payer: 59

## 2018-12-31 ENCOUNTER — Other Ambulatory Visit: Payer: Self-pay | Admitting: Gastroenterology

## 2018-12-31 DIAGNOSIS — Z1159 Encounter for screening for other viral diseases: Secondary | ICD-10-CM

## 2019-01-01 LAB — SARS CORONAVIRUS 2 (TAT 6-24 HRS): SARS Coronavirus 2: NEGATIVE

## 2019-01-02 ENCOUNTER — Other Ambulatory Visit: Payer: Self-pay

## 2019-01-02 ENCOUNTER — Encounter: Payer: Self-pay | Admitting: Gastroenterology

## 2019-01-02 ENCOUNTER — Ambulatory Visit (AMBULATORY_SURGERY_CENTER): Payer: 59 | Admitting: Gastroenterology

## 2019-01-02 VITALS — BP 117/77 | HR 52 | Temp 98.6°F | Resp 17 | Ht 66.0 in | Wt 162.0 lb

## 2019-01-02 DIAGNOSIS — R194 Change in bowel habit: Secondary | ICD-10-CM | POA: Diagnosis not present

## 2019-01-02 DIAGNOSIS — K648 Other hemorrhoids: Secondary | ICD-10-CM

## 2019-01-02 DIAGNOSIS — K3189 Other diseases of stomach and duodenum: Secondary | ICD-10-CM | POA: Diagnosis not present

## 2019-01-02 DIAGNOSIS — R112 Nausea with vomiting, unspecified: Secondary | ICD-10-CM

## 2019-01-02 DIAGNOSIS — K921 Melena: Secondary | ICD-10-CM

## 2019-01-02 DIAGNOSIS — R1084 Generalized abdominal pain: Secondary | ICD-10-CM

## 2019-01-02 DIAGNOSIS — N809 Endometriosis, unspecified: Secondary | ICD-10-CM

## 2019-01-02 DIAGNOSIS — R195 Other fecal abnormalities: Secondary | ICD-10-CM

## 2019-01-02 MED ORDER — SODIUM CHLORIDE 0.9 % IV SOLN: 500.0000 mL | Freq: Once | INTRAVENOUS | Status: DC

## 2019-01-02 NOTE — Progress Notes (Signed)
To PACU, VSS. Report to [email or web address] 

## 2019-01-02 NOTE — Progress Notes (Signed)
Called to room to assist during endoscopic procedure.  Patient ID and intended procedure confirmed with present staff. Received instructions for my participation in the procedure from the performing physician.  

## 2019-01-02 NOTE — Op Note (Signed)
[facility] Patient Name: Leslie Bolton Procedure Date: 01/02/2019 1:27 PM MRN: [id number] Endoscopist: Gerrit Heck , MD Age: 28 Referring MD:  Date of Birth: [birth year] Gender: Female Account #: [id number] Procedure:                Upper GI endoscopy Indications:              Generalized abdominal pain, Hematochezia, Nausea                            with vomiting                           28 yo female with a history of Endometriosis                            complicated by colonic stricture/obstruction                            requiring sigmoid resection and primary                            reanastamosis earlier this year, presents with                            episodic abdominal pain, nausea/vomiting, change in                            bowel habits, mucus-like stools, and intermittent                            hematochezia. Medicines:                Monitored Anesthesia Care Procedure:                Pre-Anesthesia Assessment:                           - Prior to the procedure, a History and Physical                            was performed, and patient medications and                            allergies were reviewed. The patient's tolerance of                            previous anesthesia was also reviewed. The risks                            and benefits of the procedure and the sedation                            options and risks were discussed with the patient.                            All questions were answered, and informed  consent                            was obtained. Prior Anticoagulants: The patient has                            taken no previous anticoagulant or antiplatelet                            agents. ASA Grade Assessment: II - A patient with                            mild systemic disease. After reviewing the risks                            and benefits, the patient was deemed in                            satisfactory  condition to undergo the procedure.                           After obtaining informed consent, the endoscope was                            passed under direct vision. Throughout the                            procedure, the patient's blood pressure, pulse, and                            oxygen saturations were monitored continuously. The                            Endoscope was introduced through the mouth, and                            advanced to the second part of duodenum. The upper                            GI endoscopy was accomplished without difficulty.                            The patient tolerated the procedure well. Scope In: Scope Out: Findings:                 A single area of ectopic gastric mucosa was found                            in the upper third of the esophagus.                           The middle third of the esophagus, lower third of  the esophagus and gastroesophageal junction were                            normal.                           The Z-line was regular and was found 40 cm from the                            incisors.                           The entire examined stomach was normal. Biopsies                            were taken with a cold forceps for Helicobacter                            pylori testing. Estimated blood loss was minimal.                           The duodenal bulb, first portion of the duodenum                            and second portion of the duodenum were normal. Complications:            No immediate complications. Estimated Blood Loss:     Estimated blood loss was minimal. Impression:               - Ectopic gastric mucosa in the upper third of the                            esophagus, consistent with benign gastric inlet                            patch.                           - Normal middle third of esophagus, lower third of                            esophagus and gastroesophageal  junction.                           - Z-line regular, 40 cm from the incisors.                           - Normal stomach. Biopsied.                           - Normal duodenal bulb, first portion of the                            duodenum and second portion of the duodenum. Recommendation:           - Patient has a contact number available  for                            emergencies. The signs and symptoms of potential                            delayed complications were discussed with the                            patient. Return to normal activities tomorrow.                            Written discharge instructions were provided to the                            patient.                           - Resume previous diet.                           - Continue present medications.                           - Await pathology results.                           - Return to GI clinic PRN.                           - Proceed with colonoscopy today. Gerrit Heck, MD 01/02/2019 2:14:50 PM

## 2019-01-02 NOTE — Op Note (Signed)
[place] Patient Name: Leslie Bolton Procedure Date: 01/02/2019 1:26 PM MRN: [id number] Endoscopist: Gerrit Heck , MD Age: 28 Referring MD:  Date of Birth: 12/24/1990 Gender: Female Account #: [id number] Procedure:                Colonoscopy Indications:              Generalized abdominal pain, Lower abdominal pain,                            Hematochezia, Post surgical follow-up, Change in                            bowel habits Medicines:                Monitored Anesthesia Care Procedure:                Pre-Anesthesia Assessment:                           - Prior to the procedure, a History and Physical                            was performed, and patient medications and                            allergies were reviewed. The patient's tolerance of                            previous anesthesia was also reviewed. The risks                            and benefits of the procedure and the sedation                            options and risks were discussed with the patient.                            All questions were answered, and informed consent                            was obtained. Prior Anticoagulants: The patient has                            taken no previous anticoagulant or antiplatelet                            agents. ASA Grade Assessment: II - A patient with                            mild systemic disease. After reviewing the risks                            and benefits, the patient was deemed in  satisfactory condition to undergo the procedure.                           After obtaining informed consent, the colonoscope                            was passed under direct vision. Throughout the                            procedure, the patient's blood pressure, pulse, and                            oxygen saturations were monitored continuously. The                            Colonoscope was introduced through the anus  and                            advanced to the the terminal ileum. The colonoscopy                            was performed without difficulty. The patient                            tolerated the procedure well. The quality of the                            bowel preparation was excellent. The terminal                            ileum, ileocecal valve, appendiceal orifice, and                            rectum were photographed. Scope In: 1:52:19 PM Scope Out: 2:03:38 PM Scope Withdrawal Time: 0 hours 10 minutes 2 seconds  Total Procedure Duration: 0 hours 11 minutes 19 seconds  Findings:                 The perianal and digital rectal examinations were                            normal.                           There was evidence of a prior end-to-side                            colo-colonic anastomosis in the sigmoid colon,                            located 25 cm from the anal verge. This was patent                            and was characterized by healthy appearing mucosa.  The anastomosis was traversed.                           Normal mucosa was otherwise noted throughout the                            colon. There were no areas of inflammation,                            erythema, edema, erosions, or ulceration noted on                            this study. Biopsies for histology were taken with                            a cold forceps from the right colon and left colon                            for evaluation of microscopic colitis. Estimated                            blood loss was minimal.                           Non-bleeding internal hemorrhoids were found during                            retroflexion. The hemorrhoids were small.                           The terminal ileum appeared normal. Complications:            No immediate complications. Estimated Blood Loss:     Estimated blood loss was minimal. Impression:               - Patent  end-to-side colo-colonic anastomosis,                            characterized by healthy appearing mucosa.                           - Normal mucosa in the entire examined colon.                            Biopsied.                           - Non-bleeding internal hemorrhoids.                           - The examined portion of the ileum was normal. Recommendation:           - Patient has a contact number available for                            emergencies. The signs and symptoms of potential  delayed complications were discussed with the                            patient. Return to normal activities tomorrow.                            Written discharge instructions were provided to the                            patient.                           - Resume previous diet.                           - Continue present medications.                           - Await pathology results.                           - Repeat colonoscopy at age 27 for screening                            purposes.                           - Use fiber, for example Citrucel, Fibercon, Konsyl                            or Metamucil.                           - Return to GI clinic PRN. Gerrit Heck, MD 01/02/2019 2:19:41 PM

## 2019-01-02 NOTE — Patient Instructions (Signed)
HANDOUTS PROVIDED ON: HEMORRHOIDS  THE BIOPSIES TAKEN TODAY HAVE BEEN SENT FOR PATHOLOGY.  THE RESULTS CAN TAKE 2-3 WEEKS TO RECEIVE.    YOU MAY RESUME YOUR PREVIOUS DIET AND MEDICATION SCHEDULE.  [place] YOU FOR ALLOWING [place] TO CARE FOR YOU TODAY!!!  YOU HAD AN ENDOSCOPIC PROCEDURE TODAY AT [facility]:   Refer to the procedure report that was given to you for any specific questions about what was found during the examination.  If the procedure report does not answer your questions, please call your gastroenterologist to clarify.  If you requested that your care partner not be given the details of your procedure findings, then the procedure report has been included in a sealed envelope for you to review at your convenience later.  YOU SHOULD EXPECT: Some feelings of bloating in the abdomen. Passage of more gas than usual.  Walking can help get rid of the air that was put into your GI tract during the procedure and reduce the bloating. If you had a lower endoscopy (such as a colonoscopy or flexible sigmoidoscopy) you may notice spotting of blood in your stool or on the toilet paper. If you underwent a bowel prep for your procedure, you may not have a normal bowel movement for a few days.  Please Note:  You might notice some irritation and congestion in your nose or some drainage.  This is from the oxygen used during your procedure.  There is no need for concern and it should clear up in a day or so.  SYMPTOMS TO REPORT IMMEDIATELY:   Following lower endoscopy (colonoscopy or flexible sigmoidoscopy):  Excessive amounts of blood in the stool  Significant tenderness or worsening of abdominal pains  Swelling of the abdomen that is new, acute  Fever of 100F or higher   Following upper endoscopy (EGD)  Vomiting of blood or coffee ground material  New chest pain or pain under the shoulder blades  Painful or persistently difficult swallowing  New shortness of breath  Fever of  100F or higher  Black, tarry-looking stools  For urgent or emergent issues, a gastroenterologist can be reached at any hour by calling [phone].   DIET:  We do recommend a small meal at first, but then you may proceed to your regular diet.  Drink plenty of fluids but you should avoid alcoholic beverages for 24 hours.  ACTIVITY:  You should plan to take it easy for the rest of today and you should NOT DRIVE or use heavy machinery until tomorrow (because of the sedation medicines used during the test).    FOLLOW UP: Our staff will call the number listed on your records 48-72 hours following your procedure to check on you and address any questions or concerns that you may have regarding the information given to you following your procedure. If we do not reach you, we will leave a message.  We will attempt to reach you two times.  During this call, we will ask if you have developed any symptoms of COVID 19. If you develop any symptoms (ie: fever, flu-like symptoms, shortness of breath, cough etc.) before then, please call [phone].  If you test positive for Covid 19 in the 2 weeks post procedure, please call and report this information to [place].    If any biopsies were taken you will be contacted by phone or by letter within the next 1-3 weeks.  Please call us at [phone] if you have not heard about the  biopsies in 3 weeks.    SIGNATURES/CONFIDENTIALITY: You and/or your care partner have signed paperwork which will be entered into your electronic medical record.  These signatures attest to the fact that that the information above on your After Visit Summary has been reviewed and is understood.  Full responsibility of the confidentiality of this discharge information lies with you and/or your care-partner.

## 2019-01-04 ENCOUNTER — Telehealth: Payer: Self-pay | Admitting: *Deleted

## 2019-01-04 NOTE — Telephone Encounter (Signed)
No answer for post procedure follow up call. Left message for patient to call and will attempt to call back later this afternoon. SM

## 2019-01-04 NOTE — Telephone Encounter (Signed)
No answer for second post procedure call back. Unable to leave message.

## 2019-01-08 ENCOUNTER — Encounter: Payer: Self-pay | Admitting: Gastroenterology

## 2019-01-10 ENCOUNTER — Encounter: Payer: Self-pay | Admitting: Obstetrics & Gynecology

## 2019-01-10 ENCOUNTER — Other Ambulatory Visit: Payer: Self-pay

## 2019-01-10 ENCOUNTER — Ambulatory Visit (INDEPENDENT_AMBULATORY_CARE_PROVIDER_SITE_OTHER): Payer: 59 | Admitting: Obstetrics & Gynecology

## 2019-01-10 VITALS — BP 124/79 | HR 69 | Ht 66.0 in | Wt 163.0 lb

## 2019-01-10 DIAGNOSIS — Z30432 Encounter for removal of intrauterine contraceptive device: Secondary | ICD-10-CM | POA: Diagnosis not present

## 2019-01-10 DIAGNOSIS — R102 Pelvic and perineal pain: Secondary | ICD-10-CM

## 2019-01-10 NOTE — Progress Notes (Signed)
Pt with h/o pelvic pain. She has a h/o surgically proven endometriosis. In the midst of the workup pt has decided that she wants to conceive and wants the IUD removed. Patient was in the dorsal lithotomy position, normal external genitalia was noted.  A speculum was placed in the patient's vagina, normal discharge was noted, no lesions. The multiparous cervix was visualized, no lesions, no abnormal discharge;  and the cervix was swabbed with Betadine using scopettes. The strings of the IUD were grasped and pulled using ring forceps.  The IUD was successfully removed in its entirety.   Patient tolerated the procedure well.    Begin PNV F/u prn Preconception counseling performed.   Total face-to-face time with patient was 15 min.  Greater than 50% was spent in counseling and coordination of care with the patient.   Jeromie Gainor L. Harraway-Smith, M.D., Cherlynn June

## 2019-01-10 NOTE — Progress Notes (Signed)
Patient would like IUD removed.

## 2019-01-10 NOTE — Patient Instructions (Signed)
Preparing for Pregnancy If you are considering becoming pregnant, make an appointment to see your regular health care provider to learn how to prepare for a safe and healthy pregnancy (preconception care). During a preconception care visit, your health care provider will:  Do a complete physical exam, including a Pap test.  Take a complete medical history.  Give you information, answer your questions, and help you resolve problems. Preconception checklist Medical history  Tell your health care provider about any current or past medical conditions. Your pregnancy or your ability to become pregnant may be affected by chronic conditions, such as diabetes, chronic hypertension, and thyroid problems.  Include your family's medical history as well as your partner's medical history.  Tell your health care provider about any history of STIs (sexually transmitted infections).These can affect your pregnancy. In some cases, they can be passed to your baby. Discuss any concerns that you have about STIs.  If indicated, discuss the benefits of genetic testing. This testing will show whether there are any genetic conditions that may be passed from you or your partner to your baby.  Tell your health care provider about: ? Any problems you have had with conception or pregnancy. ? Any medicines you take. These include vitamins, herbal supplements, and over-the-counter medicines. ? Your history of immunizations. Discuss any vaccinations that you may need. Diet  Ask your health care provider what to include in a healthy diet that has a balance of nutrients. This is especially important when you are pregnant or preparing to become pregnant.  Ask your health care provider to help you reach a healthy weight before pregnancy. ? If you are overweight, you may be at higher risk for certain complications, such as high blood pressure, diabetes, and preterm birth. ? If you are underweight, you are more likely to  have a baby who has a low birth weight. Lifestyle, work, and home  Let your health care provider know: ? About any lifestyle habits that you have, such as alcohol use, drug use, or smoking. ? About recreational activities that may put you at risk during pregnancy, such as downhill skiing and certain exercise programs. ? Tell your health care provider about any international travel, especially any travel to places with an active [place] virus outbreak. ? About harmful substances that you may be exposed to at work or at home. These include chemicals, pesticides, radiation, or even litter boxes. ? If you do not feel safe at home. Mental health  Tell your health care provider about: ? Any history of mental health conditions, including feelings of depression, sadness, or anxiety. ? Any medicines that you take for a mental health condition. These include herbs and supplements. Home instructions to prepare for pregnancy Lifestyle   Eat a balanced diet. This includes fresh fruits and vegetables, whole grains, lean meats, low-fat dairy products, healthy fats, and foods that are high in fiber. Ask to meet with a nutritionist or registered dietitian for assistance with meal planning and goals.  Get regular exercise. Try to be active for at least 30 minutes a day on most days of the week. Ask your health care provider which activities are safe during pregnancy.  Do not use any products that contain nicotine or tobacco, such as cigarettes and e-cigarettes. If you need help quitting, ask your health care provider.  Do not drink alcohol.  Do not take illegal drugs.  Maintain a healthy weight. Ask your health care provider what weight range is right for you. General  instructions  Keep an accurate record of your menstrual periods. This makes it easier for your health care provider to determine your baby's due date.  Begin taking prenatal vitamins and folic acid supplements daily as directed by your  health care provider.  Manage any chronic conditions, such as high blood pressure and diabetes, as told by your health care provider. This is important. How do I know that I am pregnant? You may be pregnant if you have been sexually active and you miss your period. Symptoms of early pregnancy include:  Mild cramping.  Very light vaginal bleeding (spotting).  Feeling unusually tired.  Nausea and vomiting (morning sickness). If you have any of these symptoms and you suspect that you might be pregnant, you can take a home pregnancy test. These tests check for a hormone in your urine (human chorionic gonadotropin, or hCG). A woman's body begins to make this hormone during early pregnancy. These tests are very accurate. Wait until at least the first day after you miss your period to take one. If the test shows that you are pregnant (you get a positive result), call your health care provider to make an appointment for prenatal care. What should I do if I become pregnant?      Make an appointment with your health care provider as soon as you suspect you are pregnant.  Do not use any products that contain nicotine, such as cigarettes, chewing tobacco, and e-cigarettes. If you need help quitting, ask your health care provider.  Do not drink alcoholic beverages. Alcohol is related to a number of birth defects.  Avoid toxic odors and chemicals.  You may continue to have sexual intercourse if it does not cause pain or other problems, such as vaginal bleeding. This information is not intended to replace advice given to you by your health care provider. Make sure you discuss any questions you have with your health care provider. Document Released: 12/31/2007 Document Revised: 01/19/2017 Document Reviewed: 08/09/2015 Elsevier Patient Education  2020 [employer].

## 2019-02-25 ENCOUNTER — Encounter: Payer: Self-pay | Admitting: Family Medicine

## 2019-02-27 ENCOUNTER — Encounter: Payer: Self-pay | Admitting: Family Medicine

## 2019-02-27 ENCOUNTER — Telehealth (INDEPENDENT_AMBULATORY_CARE_PROVIDER_SITE_OTHER): Payer: 59 | Admitting: Family Medicine

## 2019-02-27 VITALS — Ht 66.0 in | Wt 165.0 lb

## 2019-02-27 DIAGNOSIS — L700 Acne vulgaris: Secondary | ICD-10-CM | POA: Diagnosis not present

## 2019-02-27 DIAGNOSIS — F419 Anxiety disorder, unspecified: Secondary | ICD-10-CM

## 2019-02-27 DIAGNOSIS — G4700 Insomnia, unspecified: Secondary | ICD-10-CM

## 2019-02-27 MED ORDER — MIRTAZAPINE 30 MG PO TABS: 30.0000 mg | ORAL_TABLET | Freq: Every day | ORAL | 1 refills | Status: DC

## 2019-02-27 MED ORDER — RAMELTEON 8 MG PO TABS: 8.0000 mg | ORAL_TABLET | Freq: Every day | ORAL | 3 refills | Status: AC

## 2019-02-27 MED ORDER — CLINDAMYCIN PHOSPHATE 1 % EX SWAB: CUTANEOUS | 3 refills | Status: AC

## 2019-02-27 NOTE — Progress Notes (Signed)
Virtual Visit via Video Note  I connected with Leslie Bolton on 02/27/19 at  8:30 AM EST by a video enabled telemedicine application and verified that I am speaking with the correct person using two identifiers. Location patient: home Location provider: home office Persons participating in the virtual visit: patient, provider  I discussed the limitations of evaluation and management by telemedicine and the availability of in person appointments. The patient expressed understanding and agreed to proceed.  Chief Complaint  Patient presents with  . Follow-up    pt would like to talk about continuing sleep medication due to patient no longer being a patient of the doctor who prescribed the medication     HPI: Leslie Bolton is a 29 y.o. female to f/u on chronic medical issues including anxiety, insomnia, and acne. She needs a refill of her clindamycin wipes which she has used for a few years for her facial acne. Acne is well-controlled. She has been seeing a Biomedical scientist and NP via telehealth for her anxiety, insomnia. She is currently taking rozerem 1m, and remeron 337mqHS and this combination has been the most effective med regimen she has found to manage her symptoms. She is taking a break from counseling at this time d/t cost but requests refills of her meds    Past Medical History:  Diagnosis Date  . Anemia   . Anxiety   . Depression   . Endometriosis     Past Surgical History:  Procedure Laterality Date  . COLON SURGERY  02/05/2018   partial colectomy  . COLONOSCOPY    . PARTIAL COLECTOMY     was found to have endometriosis  . WISDOM TOOTH EXTRACTION     removal of all 4. Age 29  Family History  Problem Relation Age of Onset  . Diverticulitis Father        since 2000  . Diabetes Father   . Prostate cancer Maternal Grandfather   . Lung cancer Paternal Grandfather        took vocal cords out  . Colon cancer Neg Hx     Social History   Tobacco Use  .  Smoking status: Never Smoker  . Smokeless tobacco: Never Used  . Tobacco comment: tried it once   Substance Use Topics  . Alcohol use: Yes    Comment: once a week  . Drug use: Not Currently    Comment: have used marijuana for stomach issues     Current Outpatient Medications:  .  clindamycin (CLEOCIN T) 1 % SWAB, Apply to face BID, Disp: 180 each, Rfl: 3 .  mirtazapine (REMERON) 30 MG tablet, Take 1 tablet (30 mg total) by mouth at bedtime., Disp: 90 tablet, Rfl: 1 .  Multiple Vitamin (MULTIVITAMIN WITH MINERALS) TABS tablet, Take 1 tablet by mouth daily., Disp: , Rfl:  .  ondansetron (ZOFRAN) 4 MG tablet, Take 4 mg by mouth daily as needed. , Disp: , Rfl:  .  ramelteon (ROZEREM) 8 MG tablet, Take 1 tablet (8 mg total) by mouth at bedtime., Disp: 90 tablet, Rfl: 3  No Known Allergies    ROS: See pertinent positives and negatives per HPI.   EXAM:  VITALS per patient if applicable:  GENERAL: alert, oriented, appears well and in no acute distress  HEENT: atraumatic, conjunctiva clear, no obvious abnormalities on inspection of external nose and ears  NECK: normal movements of the head and neck  LUNGS: on inspection no signs of respiratory distress, breathing  rate appears normal, no obvious gross SOB, gasping or wheezing, no conversational dyspnea  CV: no obvious cyanosis  PSYCH/NEURO: pleasant and cooperative, no obvious depression or anxiety, speech and thought processing grossly intact   ASSESSMENT AND PLAN:  1. Anxiety - stable, well-controlled Refill: - mirtazapine (REMERON) 30 MG tablet; Take 1 tablet (30 mg total) by mouth at bedtime.  Dispense: 90 tablet; Refill: 1  2. Insomnia, unspecified type - stable, well-controlled Refill - ramelteon (ROZEREM) 8 MG tablet; Take 1 tablet (8 mg total) by mouth at bedtime.  Dispense: 90 tablet; Refill: 3  3. Acne vulgaris - stable, well-controlled Refill: - clindamycin (CLEOCIN T) 1 % SWAB; Apply to face BID  Dispense:  180 each; Refill: 3   I discussed the assessment and treatment plan with the patient. The patient was provided an opportunity to ask questions and all were answered. The patient agreed with the plan and demonstrated an understanding of the instructions.   The patient was advised to call back or seek an in-person evaluation if the symptoms worsen or if the condition fails to improve as anticipated.   Letta Median, DO

## 2019-04-02 ENCOUNTER — Encounter: Payer: Self-pay | Admitting: Family Medicine

## 2019-04-10 ENCOUNTER — Encounter: Payer: Self-pay | Admitting: Family Medicine

## 2019-04-11 ENCOUNTER — Other Ambulatory Visit: Payer: Self-pay

## 2019-04-11 DIAGNOSIS — Z111 Encounter for screening for respiratory tuberculosis: Secondary | ICD-10-CM

## 2019-04-11 NOTE — Progress Notes (Signed)
Per [facility] created future order for Leslie Bolton for TB screening for school/pt is scheduled for Tuesday for lab visit and will give her a copy of [facility] vaccines for her/thx dmf

## 2019-04-15 ENCOUNTER — Other Ambulatory Visit: Payer: Self-pay

## 2019-04-16 ENCOUNTER — Other Ambulatory Visit (INDEPENDENT_AMBULATORY_CARE_PROVIDER_SITE_OTHER): Payer: 59

## 2019-04-16 DIAGNOSIS — Z111 Encounter for screening for respiratory tuberculosis: Secondary | ICD-10-CM

## 2019-04-18 LAB — QUANTIFERON-TB GOLD PLUS
Mitogen-NIL: >10 IU/mL
NIL: 0.03 IU/mL
QuantiFERON-TB Gold Plus: NEGATIVE
TB1-NIL: 0.01 IU/mL
TB2-NIL: 0.01 IU/mL

## 2019-04-24 ENCOUNTER — Encounter: Payer: Self-pay | Admitting: Family Medicine

## 2019-05-06 ENCOUNTER — Encounter: Payer: Self-pay | Admitting: Family Medicine

## 2019-05-08 ENCOUNTER — Other Ambulatory Visit: Payer: Self-pay

## 2019-05-09 ENCOUNTER — Ambulatory Visit (INDEPENDENT_AMBULATORY_CARE_PROVIDER_SITE_OTHER): Payer: 59 | Admitting: Nurse Practitioner

## 2019-05-09 ENCOUNTER — Encounter: Payer: Self-pay | Admitting: Nurse Practitioner

## 2019-05-09 ENCOUNTER — Ambulatory Visit (INDEPENDENT_AMBULATORY_CARE_PROVIDER_SITE_OTHER): Payer: 59

## 2019-05-09 VITALS — BP 110/80 | HR 76 | Temp 97.4°F | Ht 66.0 in | Wt 173.8 lb

## 2019-05-09 DIAGNOSIS — M25542 Pain in joints of left hand: Secondary | ICD-10-CM | POA: Diagnosis not present

## 2019-05-09 MED ORDER — DICLOFENAC SODIUM 1 % EX GEL: 2.0000 g | Freq: Four times a day (QID) | CUTANEOUS | 0 refills | Status: AC

## 2019-05-09 NOTE — Progress Notes (Signed)
Subjective:  Patient ID: Leslie Bolton, female    DOB: 1990/03/16  Age: 29 y.o. MRN: [id number]  CC: Hand Pain (fell off horse in Jan. an didn't think anything of it but fell on left hand and gradually pain has got worse and her grasp is worse)  Hand Pain  Incident onset: 56month ago. The incident occurred at work. The injury mechanism was a fall and twisted. The pain is present in the left wrist and left fingers. The quality of the pain is described as aching. The pain does not radiate. Associated symptoms include muscle weakness. Pertinent negatives include no chest pain, numbness or tingling. The symptoms are aggravated by movement, palpation and lifting. She has tried nothing for the symptoms.   Reviewed past Medical, Social and Family history today.  Outpatient Medications Prior to Visit  Medication Sig Dispense Refill  . clindamycin (CLEOCIN T) 1 % SWAB Apply to face BID 180 each 3  . Multiple Vitamin (MULTIVITAMIN WITH MINERALS) TABS tablet Take 1 tablet by mouth daily.    . ondansetron (ZOFRAN) 4 MG tablet Take 4 mg by mouth daily as needed.     . ramelteon (ROZEREM) 8 MG tablet Take 1 tablet (8 mg total) by mouth at bedtime. (Patient not taking: Reported on 05/09/2019) 90 tablet 3   No facility-administered medications prior to visit.    ROS See HPI  Objective:  BP 110/80   Pulse 76   Temp (!) 97.4 F (36.3 C) (Tympanic)   Ht 5' 6"  (1.676 m)   Wt 173 lb 12.8 oz (78.8 kg)   LMP 04/27/2019   SpO2 98%   BMI 28.05 kg/m   BP Readings from Last 3 Encounters:  05/09/19 110/80  01/10/19 124/79  01/02/19 117/77    Wt Readings from Last 3 Encounters:  05/09/19 173 lb 12.8 oz (78.8 kg)  02/27/19 165 lb (74.8 kg)  01/10/19 163 lb (73.9 kg)    Physical Exam Musculoskeletal:        General: Tenderness and signs of injury present.     Right wrist: Normal.     Left wrist: Snuff box tenderness present. No swelling, deformity, effusion, tenderness, bony tenderness or  crepitus. Normal range of motion. Normal pulse.     Right hand: Normal. Normal pulse.     Left hand: Tenderness and bony tenderness present. Decreased strength of finger abduction and wrist extension. Normal strength of thumb/finger opposition. Normal pulse.  Neurological:     Mental Status: She is alert and oriented to person, place, and time.    Lab Results  Component Value Date   WBC 15.7 (H) 02/02/2018   HGB 13.1 02/02/2018   HCT 41.4 02/02/2018   PLT 339 02/02/2018   GLUCOSE 96 02/02/2018   ALT 12 02/01/2018   AST 13 (L) 02/01/2018   NA 135 02/02/2018   K 4.5 02/02/2018   CL 103 02/02/2018   CREATININE 0.76 02/02/2018   BUN 9 02/02/2018   CO2 22 02/02/2018   TSH 2.90 10/26/2017    Assessment & Plan:  This visit occurred during the SARS-CoV-2 public health emergency.  Safety protocols were in place, including screening questions prior to the visit, additional usage of staff PPE, and extensive cleaning of exam room while observing appropriate contact time as indicated for disinfecting solutions.   [name] was seen today for hand pain.  Diagnoses and all orders for this visit:  Pain in thumb joint with movement of left hand -  DG Wrist Navic Only Left -     diclofenac Sodium (VOLTAREN) 1 % GEL; Apply 2 g topically 4 (four) times daily. -     Ambulatory referral to Sports Medicine   I am having Leslie Bolton start on diclofenac Sodium. I am also having her maintain her multivitamin with minerals, ondansetron, clindamycin, and ramelteon.  Meds ordered this encounter  Medications  . diclofenac Sodium (VOLTAREN) 1 % GEL    Sig: Apply 2 g topically 4 (four) times daily.    Dispense:  50 g    Refill:  0    Order Specific Question:   Supervising Provider    Answer:   Ronnald Nian [[id number]]    Problem List Items Addressed This Visit    None    Visit Diagnoses    Pain in thumb joint with movement of left hand    -  Primary   Relevant Medications    diclofenac Sodium (VOLTAREN) 1 % GEL   Other Relevant Orders   DG Wrist Navic Only Left (Completed)   Ambulatory referral to Sports Medicine      Follow-up: Return if symptoms worsen or fail to improve.  Wilfred Lacy, NP

## 2019-05-09 NOTE — Patient Instructions (Addendum)
Ok to use wrist brace with spica splint. No fracture per X-ray. Ok to use voltaren gel OTC BID x 1-2weeks.

## 2019-08-28 ENCOUNTER — Encounter: Payer: Self-pay | Admitting: Family Medicine

## 2019-09-04 ENCOUNTER — Encounter: Payer: Self-pay | Admitting: Family Medicine

## 2019-09-04 ENCOUNTER — Telehealth (INDEPENDENT_AMBULATORY_CARE_PROVIDER_SITE_OTHER): Payer: 59 | Admitting: Family Medicine

## 2019-09-04 VITALS — Ht 66.0 in

## 2019-09-04 DIAGNOSIS — K9049 Malabsorption due to intolerance, not elsewhere classified: Secondary | ICD-10-CM | POA: Diagnosis not present

## 2019-09-04 NOTE — Progress Notes (Signed)
Virtual Visit via Video Note  I connected with Leslie Bolton on 09/04/19 at  4:00 PM EDT by a video enabled telemedicine application and verified that I am speaking with the correct person using two identifiers. Location patient: work Location provider: work  Persons participating in the virtual visit: patient, provider  I discussed the limitations of evaluation and management by telemedicine and the availability of in person appointments. The patient expressed understanding and agreed to proceed.  Chief Complaint  Patient presents with  . Acute Visit    Pt c/o that she can not digest any alcohol, without it leading her to vomit.  Starting 3 months ago.  Pt explains that whenever and how much ever she consumes, it leads her to vomit.     HPI: Leslie Bolton is a 29 y.o. female seen today with complaint of n/v/d when she consumes any type or amount of alcohol. This has been going on for about 3 mo. She does not experience flushing, lightheadedness, palpitations.  She does not experience these symptoms other than when she drinks alcohol - 1-2 drinks/mo. She notes that her brother has symptoms with a few types of beer and wine but can tolerate others.    Past Medical History:  Diagnosis Date  . Anemia   . Anxiety   . Depression   . Endometriosis     Past Surgical History:  Procedure Laterality Date  . COLON SURGERY  02/05/2018   partial colectomy  . COLONOSCOPY    . PARTIAL COLECTOMY     was found to have endometriosis  . WISDOM TOOTH EXTRACTION     removal of all 4. Age 54    Family History  Problem Relation Age of Onset  . Diverticulitis Father        since 2000  . Diabetes Father   . Prostate cancer Maternal Grandfather   . Lung cancer Paternal Grandfather        took vocal cords out  . Colon cancer Neg Hx     Social History   Tobacco Use  . Smoking status: Never Smoker  . Smokeless tobacco: Never Used  . Tobacco comment: tried it once   Vaping Use  .  Vaping Use: Never used  Substance Use Topics  . Alcohol use: Yes    Comment: once a week  . Drug use: Not Currently    Comment: have used marijuana for stomach issues     Current Outpatient Medications:  .  clindamycin (CLEOCIN T) 1 % SWAB, Apply to face BID, Disp: 180 each, Rfl: 3 .  prenatal vitamin w/FE, FA (NATACHEW) 29-1 MG CHEW chewable tablet, Chew 1 tablet by mouth daily at 12 noon., Disp: , Rfl:  .  diclofenac Sodium (VOLTAREN) 1 % GEL, Apply 2 g topically 4 (four) times daily., Disp: 50 g, Rfl: 0 .  Multiple Vitamin (MULTIVITAMIN WITH MINERALS) TABS tablet, Take 1 tablet by mouth daily. (Patient not taking: Reported on 09/04/2019), Disp: , Rfl:  .  ondansetron (ZOFRAN) 4 MG tablet, Take 4 mg by mouth daily as needed.  (Patient not taking: Reported on 09/04/2019), Disp: , Rfl:  .  ramelteon (ROZEREM) 8 MG tablet, Take 1 tablet (8 mg total) by mouth at bedtime. (Patient not taking: Reported on 05/09/2019), Disp: 90 tablet, Rfl: 3  No Known Allergies    ROS: See pertinent positives and negatives per HPI.   EXAM:  VITALS per patient if applicable: Ht 5' 6"  (1.676 m)   LMP 08/29/2019  BMI 28.05 kg/m    GENERAL: alert, oriented, appears well and in no acute distress  HEENT: atraumatic, conjunctiva clear, no obvious abnormalities on inspection of external nose and ears  NECK: normal movements of the head and neck  LUNGS: on inspection no signs of respiratory distress, breathing rate appears normal, no obvious gross SOB, gasping or wheezing, no conversational dyspnea  CV: no obvious cyanosis  PSYCH/NEURO: pleasant and cooperative, speech and thought processing grossly intact   ASSESSMENT AND PLAN: 1. Alcohol intolerance - n/v/d with any type or amount of alcohol  - pt has 1-2 drinks/month so is fine to avoid. If she does avoid alcohol, she does not have symptoms - f/u PRN   I discussed the assessment and treatment plan with the patient. The patient was provided an  opportunity to ask questions and all were answered. The patient agreed with the plan and demonstrated an understanding of the instructions.   The patient was advised to call back or seek an in-person evaluation if the symptoms worsen or if the condition fails to improve as anticipated.   Letta Median, DO

## 2019-10-25 DIAGNOSIS — N809 Endometriosis, unspecified: Secondary | ICD-10-CM

## 2019-11-05 NOTE — Addendum Note (Signed)
Addended by: Truett Mainland on: 11/05/2019 02:37 PM   Modules accepted: Orders

## 2019-11-28 ENCOUNTER — Ambulatory Visit
Admission: RE | Admit: 2019-11-28 | Discharge: 2019-11-28 | Disposition: A | Discharge status: Home / Self Care | Payer: 59 | Source: Ambulatory Visit | Attending: Family Medicine | Admitting: Family Medicine

## 2019-11-28 DIAGNOSIS — N809 Endometriosis, unspecified: Secondary | ICD-10-CM

## 2020-03-26 IMAGING — CT CT ABD-PELV W/ CM
2 of 4 series · 15 of 46 positions shown, 17 images · IV contrast (ISOVUE 300)
Comparison: 12/05/2008

CLINICAL DATA: Mid abdominal pain for 2 months.

EXAM:
CT ABDOMEN AND PELVIS WITH CONTRAST
TECHNIQUE: Multidetector CT imaging of the abdomen and pelvis was performed
using the standard protocol following bolus administration of
intravenous contrast.
CONTRAST:  100mL ZYK0CR-IOO IOPAMIDOL (ZYK0CR-IOO) INJECTION 61%

[Series 2: abd/pel w · axial · 0.68mm/px · z∈[-351,+34]mm · 12 of 89 slices shown, 14 images]
[im 8/89  soft-tissue]
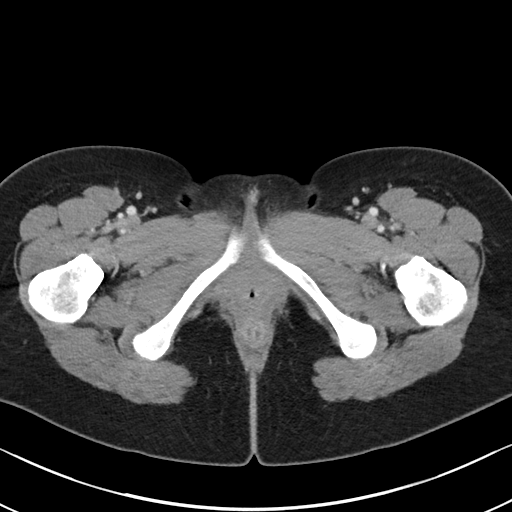
[im 8/89  bone]
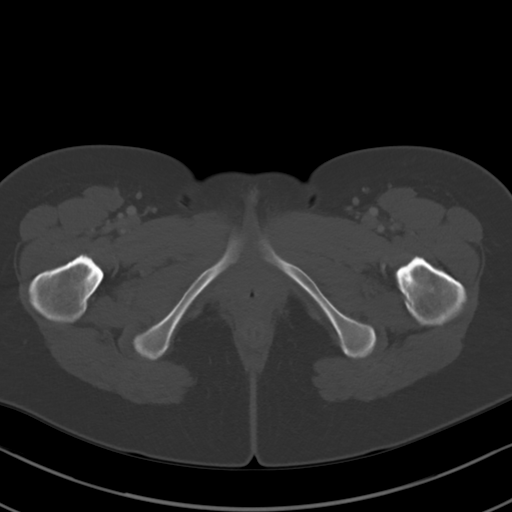
[im 15/89  soft-tissue]
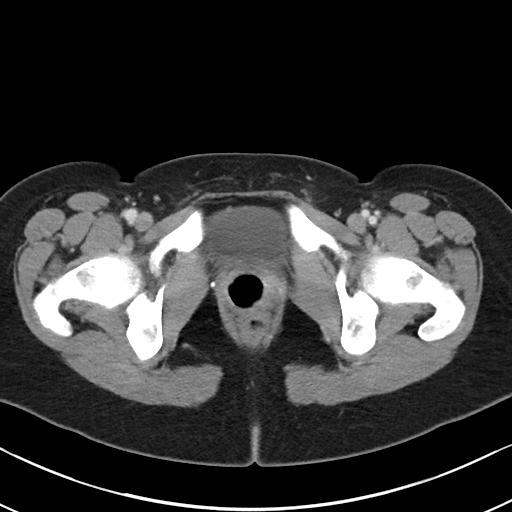
[im 22/89  soft-tissue]
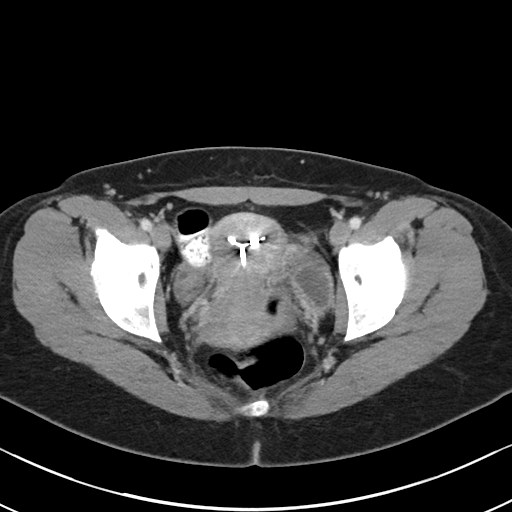
[im 29/89  soft-tissue]
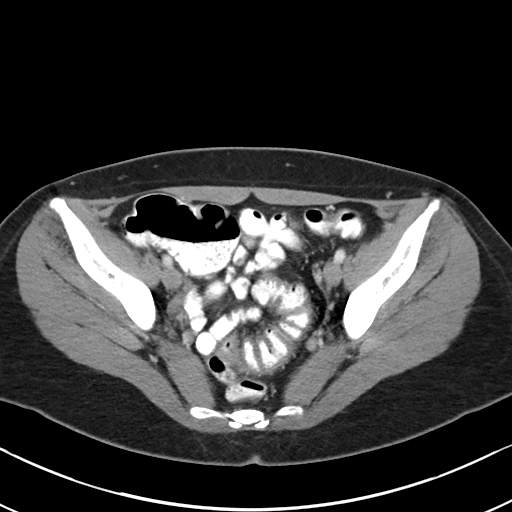
[im 36/89  soft-tissue]
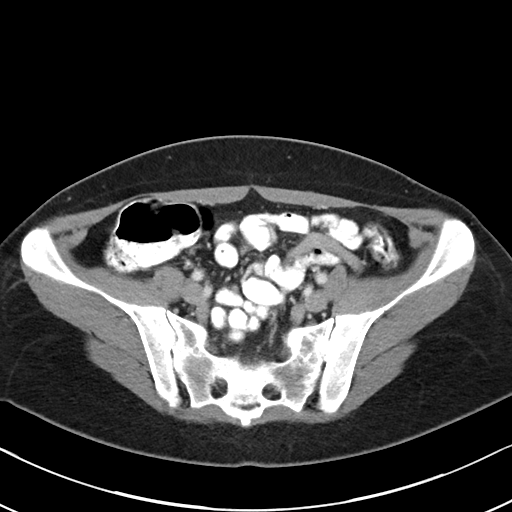
[im 43/89  soft-tissue]
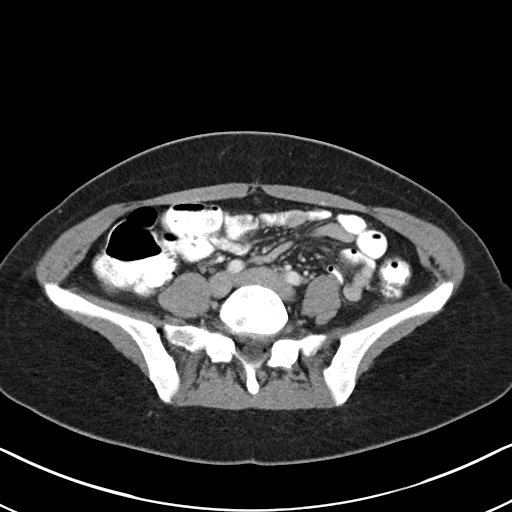
[im 50/89  soft-tissue]
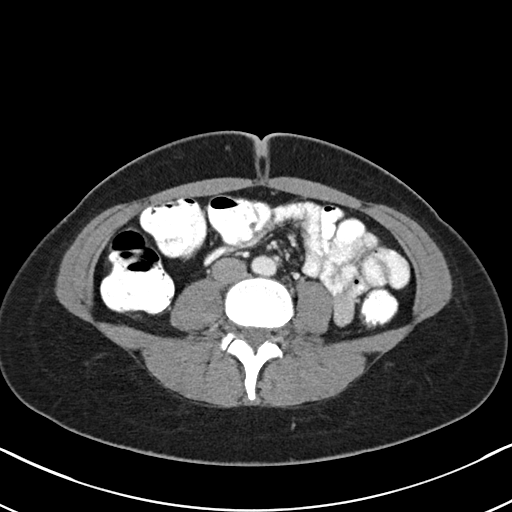
[im 57/89  soft-tissue]
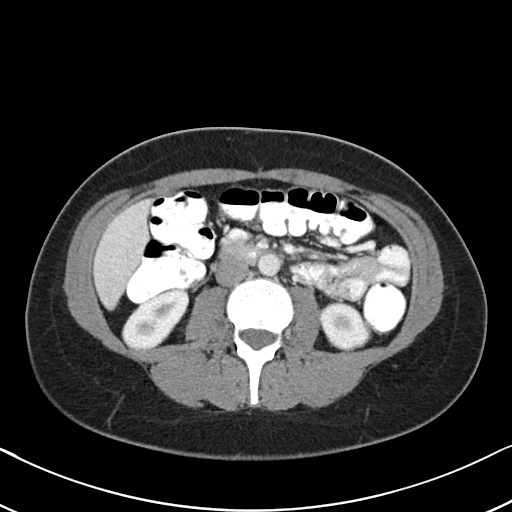
[im 64/89  soft-tissue]
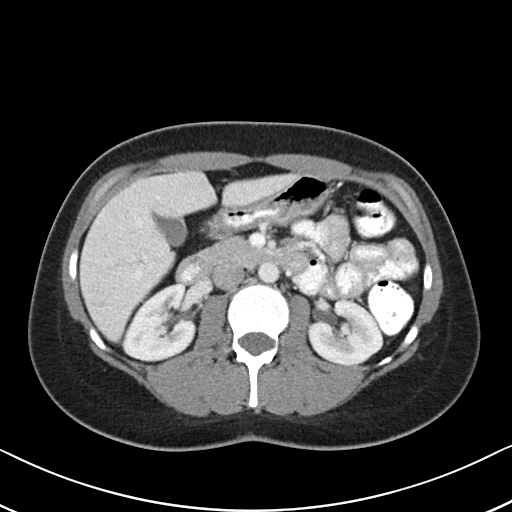
[im 64/89  bone]
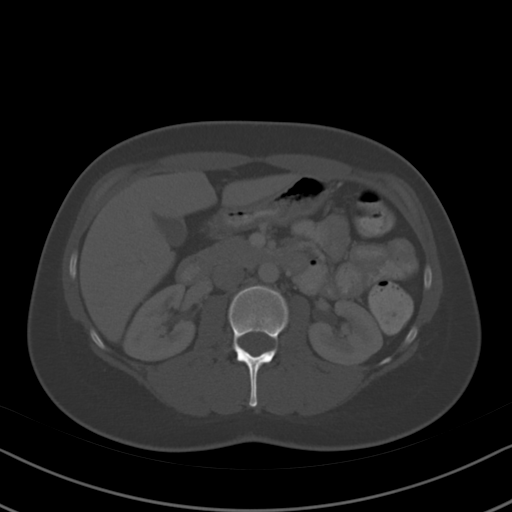
[im 71/89  soft-tissue]
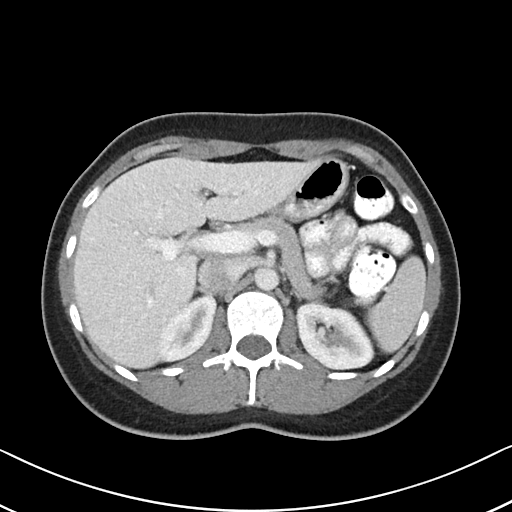
[im 78/89  soft-tissue]
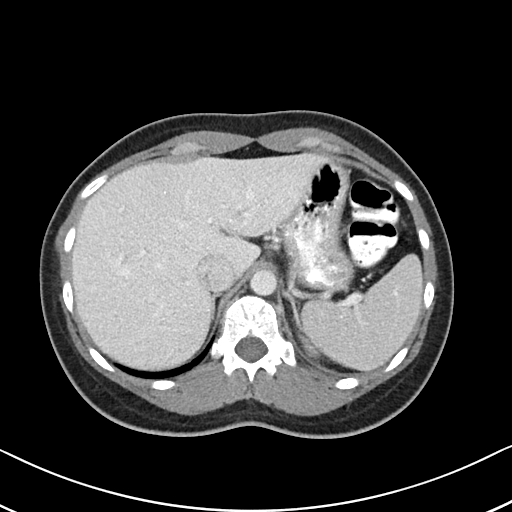
[im 85/89  soft-tissue]
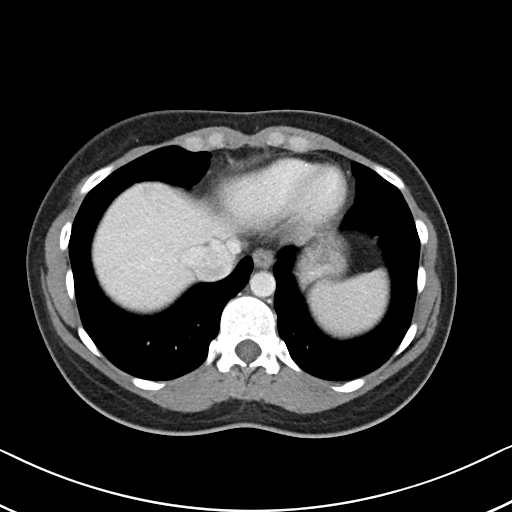

[Series 5: abd/pel w st · coronal · 0.61mm/px · 3 of 73 slices shown]
[im 25/73  soft-tissue]
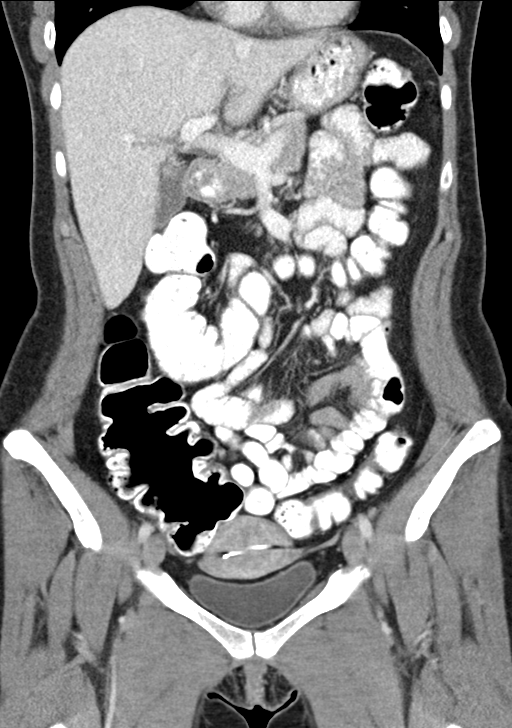
[im 33/73  soft-tissue]
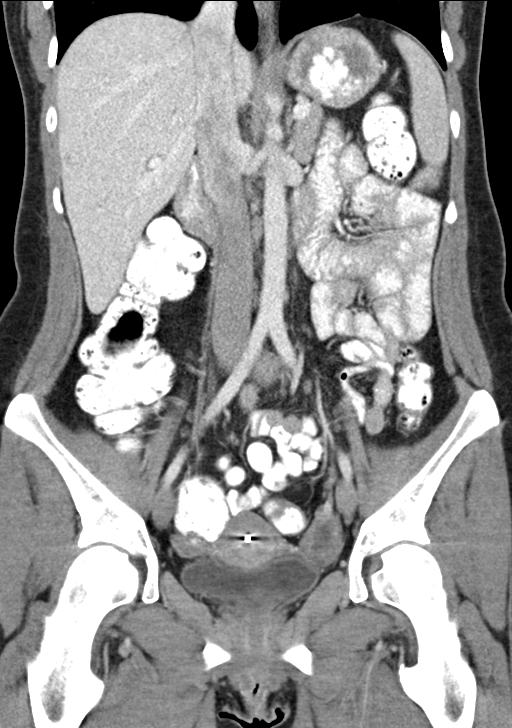
[im 41/73  soft-tissue]
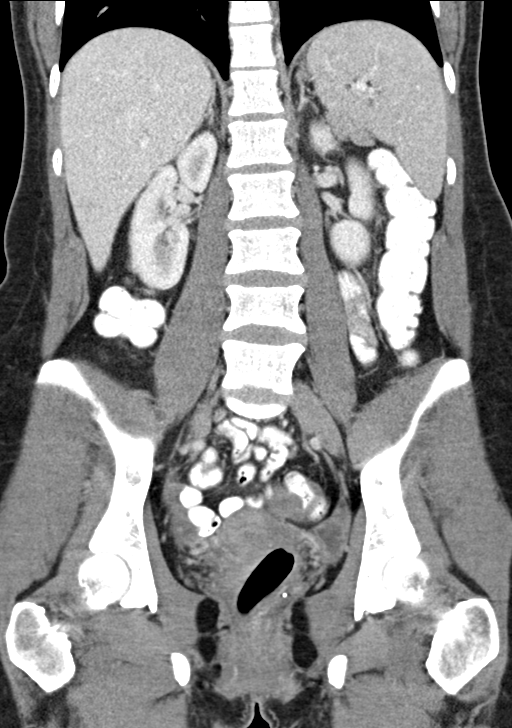

[15 of 46 positions shown; findings below may reference images not displayed]

FINDINGS: Lower chest: No acute abnormality.

Hepatobiliary: No focal liver abnormality is seen. No gallstones,
gallbladder wall thickening, or biliary dilatation.

Pancreas: Unremarkable. No pancreatic ductal dilatation or
surrounding inflammatory changes.

Spleen: Normal in size without focal abnormality.

Adrenals/Urinary Tract: Adrenal glands are unremarkable. Kidneys are
normal, without renal calculi, focal lesion, or hydronephrosis.
Bladder is unremarkable.

Stomach/Bowel: Stomach is within normal limits. Appendix appears
normal. No evidence of small bowel wall thickening, distention, or
inflammatory changes. The terminal ileum appears normal. Long
segment of mucosal thickening of the distal sigmoid colon and
rectum, best seen on image 60/89, sequence 2. Mild pericolonic
inflammatory changes in the surrounding mesentery. No evidence of
abscess formation or rupture.

Vascular/Lymphatic: No significant vascular findings are present. No
enlarged abdominal or pelvic lymph nodes.

Reproductive: Normal appearance of the right ovary. IUD in place.
3.2 cm left ovarian cyst.

Other: No abdominal wall hernia or abnormality. No abdominopelvic
ascites.

Musculoskeletal: No acute or significant osseous findings.
IMPRESSION: Long segment of marked mucosal thickening of the distal sigmoid
colon and rectum, with mild pericolonic inflammatory changes, likely
related to Crohn's disease.

3.2 cm left ovarian cyst. This is most certainly benign and possibly
physiologic.

## 2020-11-03 ENCOUNTER — Telehealth: Payer: Self-pay | Admitting: Gastroenterology

## 2020-11-03 NOTE — Telephone Encounter (Signed)
Inbound call from patient. Would like a call back to discuss her history with Crohns.

## 2020-11-03 NOTE — Telephone Encounter (Signed)
Leslie Bolton is correct, she does NOT have a history of Crohn's Disease, but rather Endometriosis that presented with severe colonic stricture that was mimicking Crohn's.  Pathology was negative for Crohn's disease.  She had a subsequent upper endoscopy and colonoscopy that were also without IBD.  [name] she is otherwise doing well, and congratulations on the pregnancy!

## 2020-11-03 NOTE — Telephone Encounter (Signed)
Attempted to reach patient, her vm is full at this time. Unable to leave a vm.

## 2020-11-03 NOTE — Telephone Encounter (Signed)
Spoke with patient, she states that she has moved to [place] and is currently pregnant. After OB/GYN evaluation they have considered patient a high risk pregnancy based on her diagnoses of Crohn's. Pt states that at the time it was confirmed by biopsy that she had endometriosis. Pt reports that there was a thought that the endometriosis was mimicking Crohn's symptoms. Pt wanted to clarify if she actually has Crohn's disease or was it just the endometriosis. She has a follow up with her OB/GYN on Friday and would like to have this information prior to that appt. Please advise, thanks.

## 2020-11-04 NOTE — Telephone Encounter (Signed)
Attempted to reach patient twice, her vm is full and cannot accept any messages at this time. Unable to leave a vm.

## 2020-11-05 NOTE — Telephone Encounter (Signed)
Spoke with patient in regards to information. I have removed Crohn's diagnoses from patient's chart. She verbalized understanding and had no concerns at the end of the call.

## 2021-09-10 IMAGING — DX DG WRIST NAVIC ONLY*L*
1 series · 1 of 1 positions shown · non-contrast
Comparison: None.

CLINICAL DATA: Wrist and thumb injury several months ago with
persistent pain, initial encounter

EXAM:
DG WRIST NAVIC ONLY LEFT

[wrist (navicular) pa]
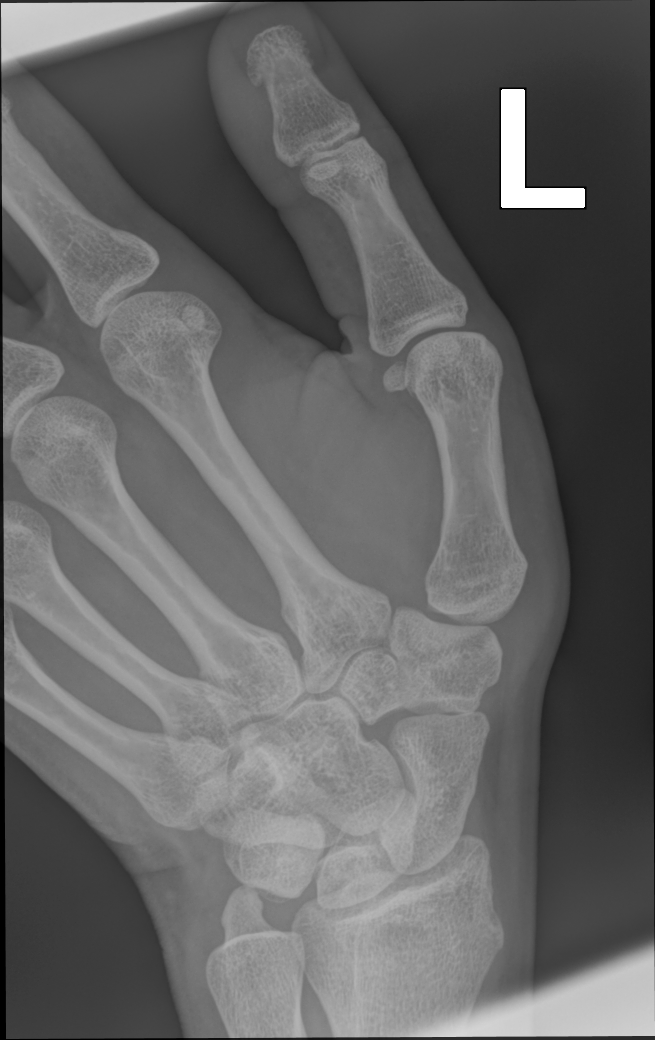

[1 of 1 positions shown; findings below may reference images not displayed]

FINDINGS: Single navicular view of the wrist shows no acute fracture or
dislocation. No soft tissue abnormality is noted.
IMPRESSION: No acute abnormality noted.

## 2022-04-01 IMAGING — RF DG HYSTEROGRAM
1 series · 7 of 7 positions shown · IV contrast (omnipaque)
Comparison: None.

CLINICAL DATA: Primary female infertility evaluation. History of
partial colectomy for endometriosis 02/05/2018.

EXAM:
HYSTEROSALPINGOGRAM
TECHNIQUE: Following cleansing of the cervix and vagina with Betadine solution,
a hysterosalpingogram was performed using a 5-French
hysterosalpingogram catheter and Omnipaque 300 contrast. The patient
tolerated the examination without difficulty.

[Series 1: one shot · 7 of 7 slices shown]
[im 1/7]
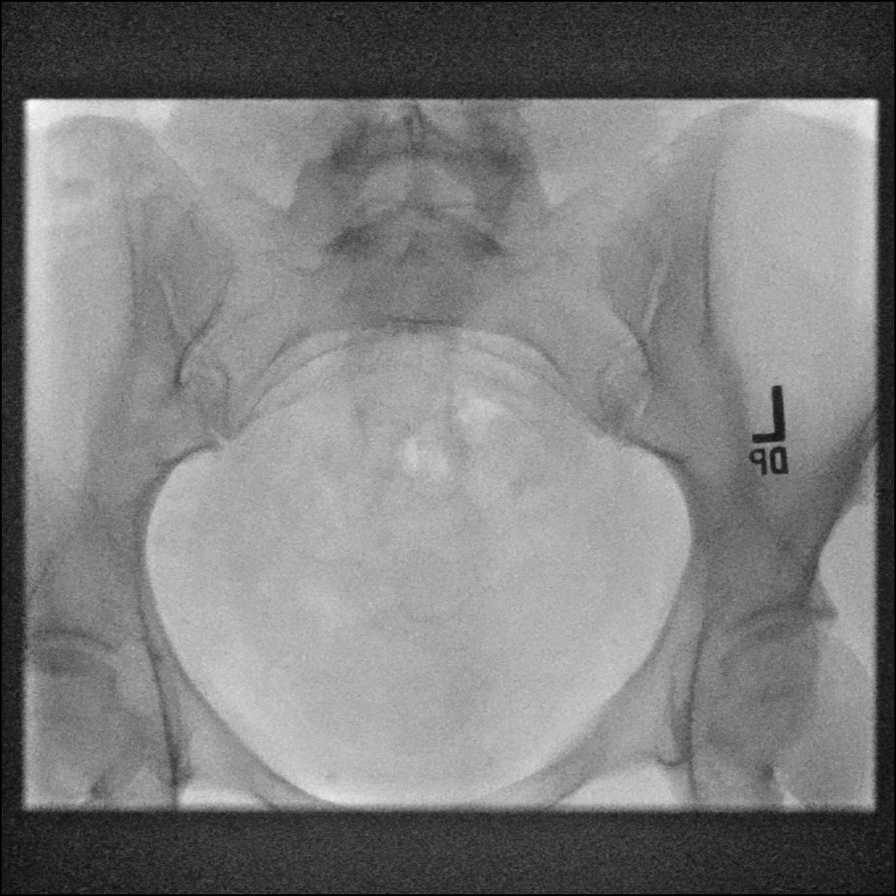
[im 2/7]
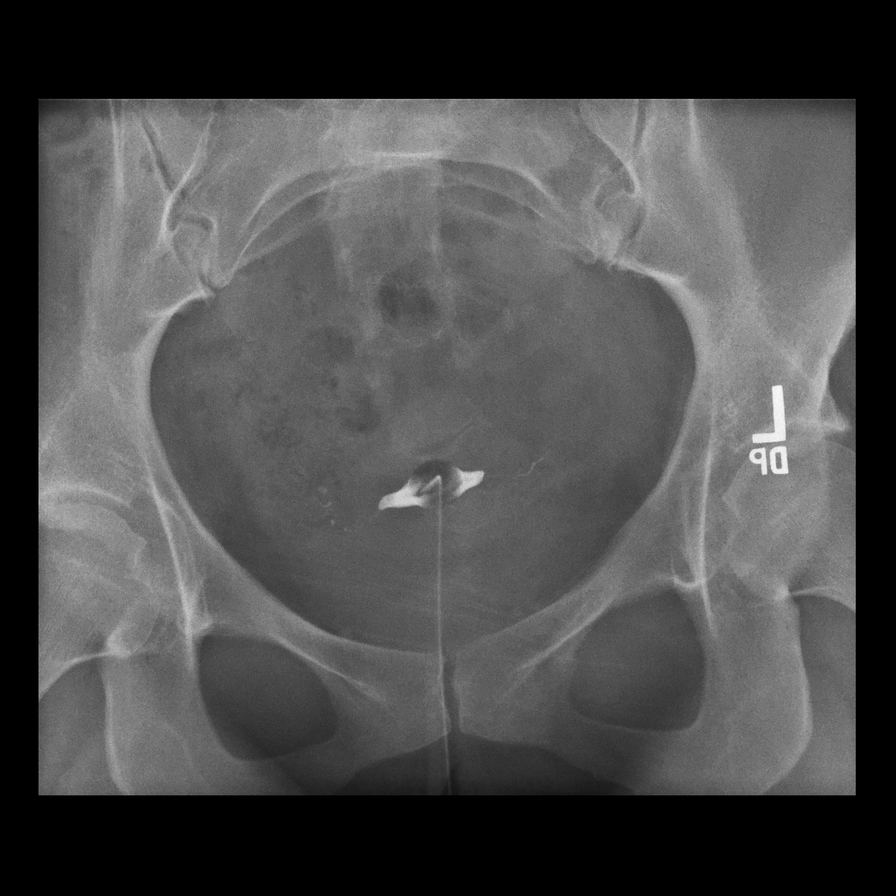
[im 3/7]
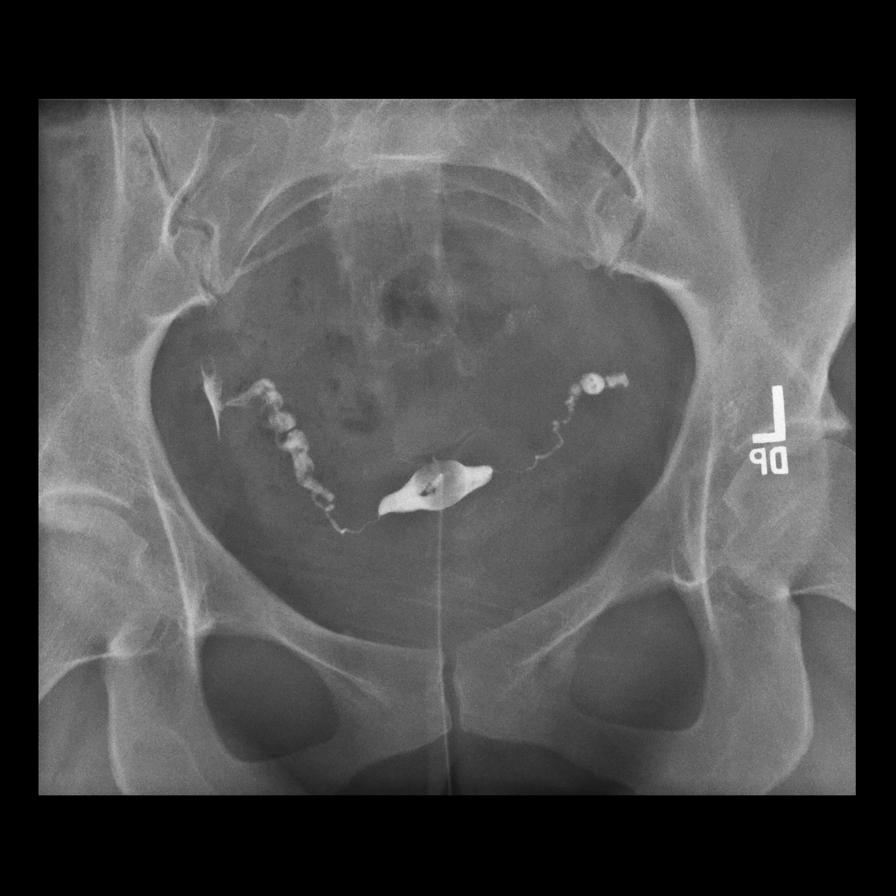
[im 4/7]
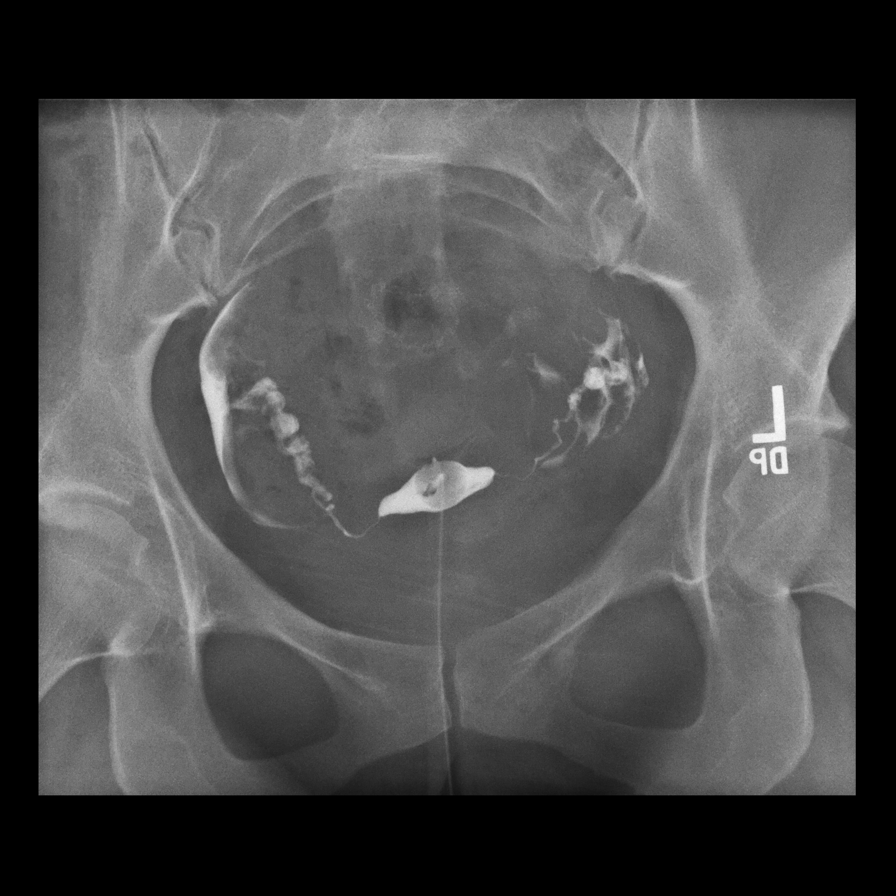
[im 5/7]
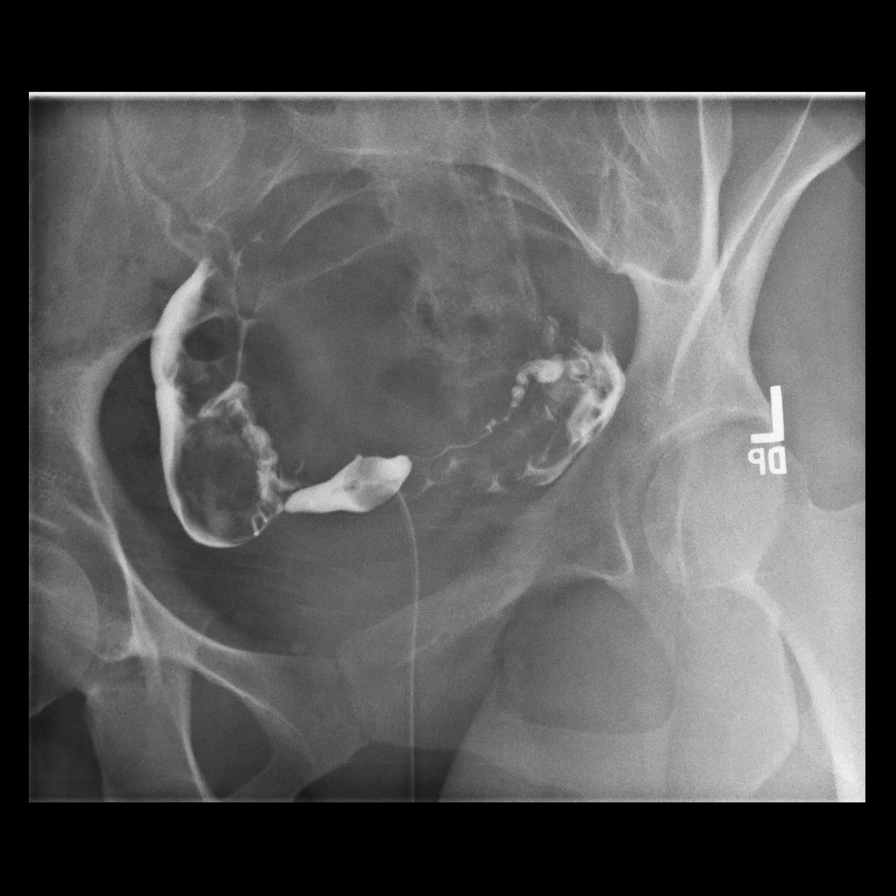
[im 6/7]
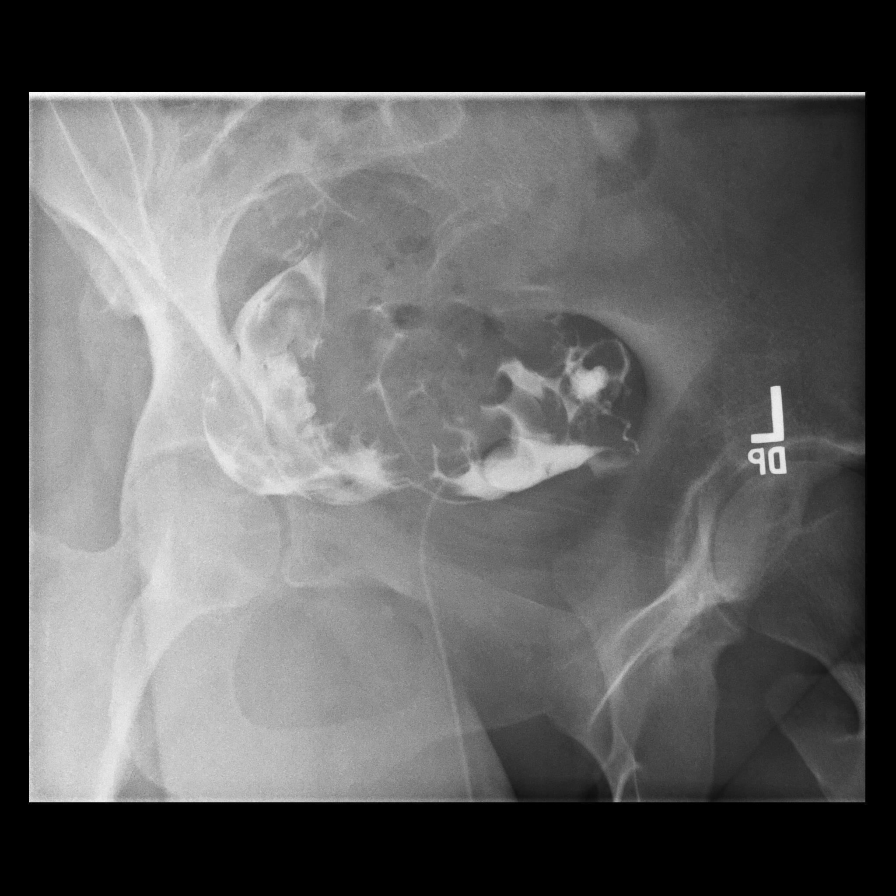
[im 7/7]
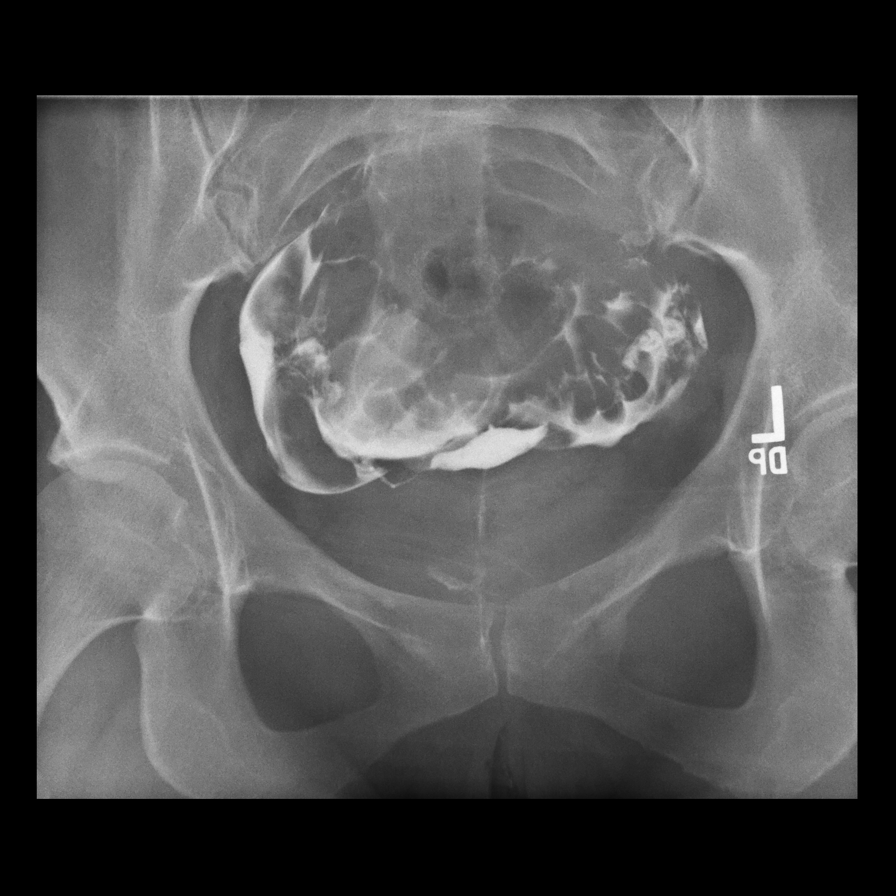

[7 of 7 positions shown; findings below may reference images not displayed]

FLUOROSCOPY TIME:  Radiation Exposure Index (as provided by the
fluoroscopic device): 61 mGy

Fluoroscopy Time:  0 minutes 54 seconds

Number of Acquired Images:  6
FINDINGS: Prompt opacification of the normal caliber and normal appearing
fallopian tubes bilaterally. Normal spillage of contrast from the
fimbriated ends of both fallopian tubes into the peritoneal cavity
and normal dispersal of contrast within the peritoneal cavity
bilaterally. Normal uterine cavity with no filling defects.
IMPRESSION: 1. Patent normal fallopian tubes bilaterally.
2. Normal uterine cavity.
